# Patient Record
Sex: Male | Born: 1965
Health system: Southern US, Community
[De-identification: ages and names within clinical notes are randomized; demographics above are authoritative.]

## PROBLEM LIST (undated history)

## (undated) DIAGNOSIS — C801 Malignant (primary) neoplasm, unspecified: Secondary | ICD-10-CM

## (undated) DIAGNOSIS — Z8489 Family history of other specified conditions: Secondary | ICD-10-CM

## (undated) DIAGNOSIS — I4891 Unspecified atrial fibrillation: Secondary | ICD-10-CM

## (undated) DIAGNOSIS — I10 Essential (primary) hypertension: Secondary | ICD-10-CM

## (undated) DIAGNOSIS — E7800 Pure hypercholesterolemia, unspecified: Secondary | ICD-10-CM

## (undated) DIAGNOSIS — T7500XA Unspecified effects of lightning, initial encounter: Secondary | ICD-10-CM

## (undated) DIAGNOSIS — K219 Gastro-esophageal reflux disease without esophagitis: Secondary | ICD-10-CM

## (undated) HISTORY — PX: COLONOSCOPY: SHX174

## (undated) HISTORY — PX: TONSILLECTOMY: SUR1361

## (undated) HISTORY — PX: HERNIA REPAIR: SHX51

## (undated) HISTORY — PX: KNEE ARTHROSCOPY: SUR90

## (undated) HISTORY — PX: ESOPHAGOGASTRODUODENOSCOPY: SHX1529

---

## 1987-12-03 DIAGNOSIS — C801 Malignant (primary) neoplasm, unspecified: Secondary | ICD-10-CM

## 1987-12-03 HISTORY — DX: Malignant (primary) neoplasm, unspecified: C80.1

## 1989-01-25 DIAGNOSIS — T7500XA Unspecified effects of lightning, initial encounter: Secondary | ICD-10-CM

## 1989-01-25 HISTORY — DX: Unspecified effects of lightning, initial encounter: T75.00XA

## 1998-05-11 ENCOUNTER — Emergency Department (HOSPITAL_COMMUNITY): Admission: EM | Admit: 1998-05-11 | Discharge: 1998-05-11 | Payer: Self-pay | Admitting: Emergency Medicine

## 1998-05-11 ENCOUNTER — Encounter: Payer: Self-pay | Admitting: Emergency Medicine

## 1998-05-13 ENCOUNTER — Ambulatory Visit (HOSPITAL_COMMUNITY): Admission: RE | Admit: 1998-05-13 | Discharge: 1998-05-13 | Payer: Self-pay | Admitting: *Deleted

## 1998-05-14 ENCOUNTER — Ambulatory Visit (HOSPITAL_COMMUNITY): Admission: RE | Admit: 1998-05-14 | Discharge: 1998-05-14 | Payer: Self-pay

## 1998-06-09 ENCOUNTER — Encounter: Admission: RE | Admit: 1998-06-09 | Discharge: 1998-07-27 | Payer: Self-pay

## 1999-10-03 ENCOUNTER — Encounter: Admission: RE | Admit: 1999-10-03 | Discharge: 1999-11-01 | Payer: Self-pay | Admitting: Orthopedic Surgery

## 2000-12-31 ENCOUNTER — Encounter: Payer: Self-pay | Admitting: Emergency Medicine

## 2000-12-31 ENCOUNTER — Emergency Department (HOSPITAL_COMMUNITY): Admission: EM | Admit: 2000-12-31 | Discharge: 2001-01-01 | Payer: Self-pay | Admitting: Emergency Medicine

## 2001-05-09 ENCOUNTER — Emergency Department (HOSPITAL_COMMUNITY): Admission: EM | Admit: 2001-05-09 | Discharge: 2001-05-09 | Payer: Self-pay | Admitting: Emergency Medicine

## 2001-05-09 ENCOUNTER — Encounter: Payer: Self-pay | Admitting: Emergency Medicine

## 2002-02-15 ENCOUNTER — Emergency Department (HOSPITAL_COMMUNITY): Admission: EM | Admit: 2002-02-15 | Discharge: 2002-02-15 | Payer: Self-pay | Admitting: Emergency Medicine

## 2002-02-15 ENCOUNTER — Encounter: Payer: Self-pay | Admitting: Emergency Medicine

## 2002-02-17 ENCOUNTER — Encounter: Payer: Self-pay | Admitting: Specialist

## 2002-02-17 ENCOUNTER — Ambulatory Visit (HOSPITAL_COMMUNITY): Admission: RE | Admit: 2002-02-17 | Discharge: 2002-02-17 | Payer: Self-pay | Admitting: *Deleted

## 2002-02-18 ENCOUNTER — Encounter: Payer: Self-pay | Admitting: Specialist

## 2002-02-18 ENCOUNTER — Ambulatory Visit (HOSPITAL_COMMUNITY)
Admission: RE | Admit: 2002-02-18 | Discharge: 2002-02-18 | Payer: Self-pay | Admitting: Physical Medicine & Rehabilitation

## 2002-04-10 ENCOUNTER — Encounter: Admission: RE | Admit: 2002-04-10 | Discharge: 2002-07-09 | Payer: Self-pay | Admitting: Specialist

## 2002-11-17 ENCOUNTER — Emergency Department (HOSPITAL_COMMUNITY): Admission: EM | Admit: 2002-11-17 | Discharge: 2002-11-18 | Payer: Self-pay | Admitting: *Deleted

## 2002-11-25 ENCOUNTER — Emergency Department (HOSPITAL_COMMUNITY): Admission: EM | Admit: 2002-11-25 | Discharge: 2002-11-25 | Payer: Self-pay | Admitting: Emergency Medicine

## 2003-07-14 ENCOUNTER — Emergency Department (HOSPITAL_COMMUNITY): Admission: EM | Admit: 2003-07-14 | Discharge: 2003-07-14 | Payer: Self-pay | Admitting: Emergency Medicine

## 2003-12-09 ENCOUNTER — Ambulatory Visit (HOSPITAL_COMMUNITY): Admission: RE | Admit: 2003-12-09 | Discharge: 2003-12-09 | Payer: Self-pay | Admitting: Cardiology

## 2005-02-04 ENCOUNTER — Inpatient Hospital Stay (HOSPITAL_COMMUNITY): Admission: EM | Admit: 2005-02-04 | Discharge: 2005-02-07 | Payer: Self-pay | Admitting: Emergency Medicine

## 2005-02-06 ENCOUNTER — Encounter (INDEPENDENT_AMBULATORY_CARE_PROVIDER_SITE_OTHER): Payer: Self-pay | Admitting: Cardiology

## 2005-02-11 ENCOUNTER — Inpatient Hospital Stay (HOSPITAL_COMMUNITY): Admission: EM | Admit: 2005-02-11 | Discharge: 2005-02-12 | Payer: Self-pay | Admitting: Emergency Medicine

## 2010-07-27 ENCOUNTER — Emergency Department (HOSPITAL_COMMUNITY)
Admission: EM | Admit: 2010-07-27 | Discharge: 2010-07-27 | Disposition: A | Discharge status: Home / Self Care | Payer: Medicaid Other | Attending: Emergency Medicine | Admitting: Emergency Medicine

## 2010-07-27 DIAGNOSIS — Z79899 Other long term (current) drug therapy: Secondary | ICD-10-CM | POA: Insufficient documentation

## 2010-07-27 DIAGNOSIS — E039 Hypothyroidism, unspecified: Secondary | ICD-10-CM | POA: Insufficient documentation

## 2010-07-27 DIAGNOSIS — R079 Chest pain, unspecified: Secondary | ICD-10-CM | POA: Insufficient documentation

## 2010-07-27 LAB — POCT CARDIAC MARKERS
CKMB, poc: 3.4 ng/mL (ref 1.0–8.0)
Myoglobin, poc: 187 ng/mL (ref 12–200)
Troponin i, poc: <0.05 ng/mL (ref 0.00–0.09)

## 2010-08-19 NOTE — Discharge Summary (Signed)
NAME:  BONHAM, ZINGALE NO.:  [id number]   MEDICAL RECORD NO.:  [id number]          PATIENT TYPE:  INP   LOCATION:  3738                         FACILITY:  [facility]   PHYSICIAN:  Mohan N. Sharyn Lull, M.D. DATE OF BIRTH:  10-14-1965   DATE OF ADMISSION:  02/04/2005  DATE OF DISCHARGE:  02/07/2005                                 DISCHARGE SUMMARY   ADMITTING DIAGNOSES:  Chest pain, atrial fibrillation with rapid ventricular  response, hypertension, hypercholesterolemia.   DISCHARGE DIAGNOSES:  Status post paroxysmal atrial fibrillation with rapid  ventricular response converted to sinus rhythm, hypertension status post  atypical chest pain, negative Persantine Myoview, hypercholesterolemia,  questionable history of myocardial infarction in the past, gastroesophageal  reflux disease.   DISCHARGE MEDICATIONS:  1.  Toprol-XL 50 mg, one tablet daily.  2. Enteric-coated aspirin 81 mg, two      tablets daily.  3. Lipitor 20 mg, one tablet daily.  4. Protonix 40 mg,      one tablet daily.  5. Nitrostat 0.4 mg sublingual use as directed.   ACTIVITY:  As tolerated.   DIET:  Low salt, low cholesterol.   FOLLOWUP:  Follow up with me in one week.   CONDITION ON DISCHARGE:  Stable.   BRIEF HISTORY AND HOSPITAL COURSE:  Mr. Bloodworth is a 45 year old black  male with a past medical history significant for questionable MI.  He came  to the ER complaining of retrosternal chest pain, sharp in nature, lasting  for 1-2 minutes at 2 p.m. and at 7 p.m. radiating to the left arm.  No  history of cocaine abuse, no alcohol or smoking.  He also felt palpitations  associated with weakness, dizziness, and diaphoresis.  The patient was noted  to be in atrial fibrillation and rapid ventricular response and received  intravenous Cardizem with spontaneous conversion to sinus rhythm.  The  patient had stress test approximately three months ago which was normal.   PAST MEDICAL HISTORY:   Questionable history of MI in 1995, history of  hypercholesterolemia.   SOCIAL HISTORY:  No history of tobacco abuse.  He is divorced and has two  sons.  He works as an Personnel officer.   PAST SURGICAL HISTORY:  Tonsillectomy at the age of 1, umbilical hernia  repair in 1996, and left knee arthroscopic surgery in the past.   MEDICATIONS AT HOME:  Lipitor and Prilosec over-the-counter.   ALLERGIES:  No known drug allergies.   FAMILY HISTORY:  Mother died of lung cancer at the age of 18.  Father died  of renal failure at the age of 52.  He has three sisters; one died from car  accident.   PHYSICAL EXAMINATION:  General: Alert and oriented x3.  Vital signs: Blood  pressure 131/77, pulse 79.  HEENT: Conjunctiva was pink.  Neck: Supple, no  JVD, no bruits.  Lungs: Clear.  Cardiovascular: S1, S2 was normal.  Abdomen:  Soft, nontender.  Extremities: No cyanosis, clubbing, or edema.   LABORATORY DATA:  Chest x-ray: Cardiomegaly without evidence of acute  cardiopulmonary disease.  His stress Myoview showed no evidence of ischemia  with EF of 70%.  His cholesterol was 179, HDL 54, LDL elevated at 114.  TSH  was 0.01, free T4 was slightly elevated at 3.16.  His four set of cardiac  enzymes were normal.  Four sets of troponin-I's were normal.  Magnesium was  1.9.  Sodium 136, potassium 3.5, chloride 105, glucose 108, BUN 13,  creatinine 1.0.   BRIEF HOSPITAL COURSE:  The patient was admitted to the telemetry unit and  started on intravenous Cardizem which was switched to p.o. and heparin.  The  patient spontaneously converted to sinus rhythm after intravenous Cardizem  bolus and remained in sinus rhythm.  The patient's MI was ruled out by  serial enzymes and EKG.  The patient subsequently underwent stress Myoview  exercise for 10 minutes on Bruce Protocol without chest pain.  Peak heart  rate was 160.  Peak blood pressure was 203/72 with no acute ischemic  changes.  Myoview scan showed no  evidence of reversible ischemia with an EF  of 70%.  The patient then had one episode of chest pain yesterday which  responded to sublingual nitroglycerin.  The patient's EKG during chest pain  showed no acute ischemic changes.  The patient did not have any further  episodes of chest pain in the last 24 hours.  The patient will be discharged  home on the above medications and will be followed up in my office in one  week.           ______________________________  Eduardo Osier. Sharyn Lull, M.D.     [facility]  D:  02/07/2005  T:  02/07/2005  Job:  [id number]

## 2010-12-29 ENCOUNTER — Emergency Department (HOSPITAL_COMMUNITY): Payer: Medicaid Other

## 2010-12-29 ENCOUNTER — Emergency Department (HOSPITAL_COMMUNITY)
Admission: EM | Admit: 2010-12-29 | Discharge: 2010-12-29 | Disposition: A | Discharge status: Home / Self Care | Payer: Medicaid Other | Attending: Emergency Medicine | Admitting: Emergency Medicine

## 2010-12-29 DIAGNOSIS — E039 Hypothyroidism, unspecified: Secondary | ICD-10-CM | POA: Insufficient documentation

## 2010-12-29 DIAGNOSIS — R079 Chest pain, unspecified: Secondary | ICD-10-CM | POA: Insufficient documentation

## 2010-12-29 DIAGNOSIS — M25519 Pain in unspecified shoulder: Secondary | ICD-10-CM | POA: Insufficient documentation

## 2010-12-29 DIAGNOSIS — W208XXA Other cause of strike by thrown, projected or falling object, initial encounter: Secondary | ICD-10-CM | POA: Insufficient documentation

## 2011-06-17 ENCOUNTER — Emergency Department (HOSPITAL_COMMUNITY)
Admission: EM | Admit: 2011-06-17 | Discharge: 2011-06-17 | Disposition: A | Discharge status: Home Health | Payer: Medicaid Other | Attending: Emergency Medicine | Admitting: Emergency Medicine

## 2011-06-17 ENCOUNTER — Encounter (HOSPITAL_COMMUNITY): Payer: Self-pay

## 2011-06-17 DIAGNOSIS — R040 Epistaxis: Secondary | ICD-10-CM | POA: Insufficient documentation

## 2011-06-17 DIAGNOSIS — I4891 Unspecified atrial fibrillation: Secondary | ICD-10-CM | POA: Insufficient documentation

## 2011-06-17 DIAGNOSIS — E7800 Pure hypercholesterolemia, unspecified: Secondary | ICD-10-CM | POA: Insufficient documentation

## 2011-06-17 DIAGNOSIS — R51 Headache: Secondary | ICD-10-CM | POA: Insufficient documentation

## 2011-06-17 DIAGNOSIS — Z79899 Other long term (current) drug therapy: Secondary | ICD-10-CM | POA: Insufficient documentation

## 2011-06-17 DIAGNOSIS — R748 Abnormal levels of other serum enzymes: Secondary | ICD-10-CM | POA: Insufficient documentation

## 2011-06-17 HISTORY — DX: Unspecified atrial fibrillation: I48.91

## 2011-06-17 HISTORY — DX: Pure hypercholesterolemia, unspecified: E78.00

## 2011-06-17 LAB — CBC
HCT: 49.6 % (ref 39.0–52.0)
Hemoglobin: 17.3 g/dL — ABNORMAL HIGH (ref 13.0–17.0)
MCH: 30.3 pg (ref 26.0–34.0)
MCHC: 34.9 g/dL (ref 30.0–36.0)
Platelets: 162 10*3/uL (ref 150–400)
RBC: 5.71 MIL/uL (ref 4.22–5.81)
RDW: 12.8 % (ref 11.5–15.5)
WBC: 4.2 10*3/uL (ref 4.0–10.5)

## 2011-06-17 LAB — HEPATIC FUNCTION PANEL
ALT: 41 U/L (ref 0–53)
AST: 21 U/L (ref 0–37)
Albumin: 3.8 g/dL (ref 3.5–5.2)
Alkaline Phosphatase: 93 U/L (ref 39–117)
Bilirubin, Direct: 0.1 mg/dL (ref 0.0–0.3)
Indirect Bilirubin: 0.4 mg/dL (ref 0.3–0.9)
Total Bilirubin: 0.5 mg/dL (ref 0.3–1.2)
Total Protein: 6.9 g/dL (ref 6.0–8.3)

## 2011-06-17 LAB — DIFFERENTIAL
Basophils Absolute: 0 10*3/uL (ref 0.0–0.1)
Basophils Relative: 1 % (ref 0–1)
Lymphs Abs: 1.5 10*3/uL (ref 0.7–4.0)
Monocytes Relative: 6 % (ref 3–12)
Neutro Abs: 2.3 10*3/uL (ref 1.7–7.7)
Neutrophils Relative %: 56 % (ref 43–77)

## 2011-06-17 LAB — CK: Total CK: 451 U/L — ABNORMAL HIGH (ref 7–232)

## 2011-06-17 MED ORDER — OXYMETAZOLINE HCL 0.05 % NA SOLN
1.0000 | Freq: Once | NASAL | Status: AC
  Administered 2011-06-17: 1 via NASAL
  Filled 2011-06-17: qty 15

## 2011-06-17 NOTE — ED Provider Notes (Signed)
Medical screening examination/treatment/procedure(s) were performed by non-physician practitioner and as supervising physician I was immediately available for consultation/collaboration.  Cheri Guppy, MD 06/17/11 [phone]

## 2011-06-17 NOTE — ED Notes (Signed)
Patient is resting comfortably. 

## 2011-06-17 NOTE — Discharge Instructions (Signed)

## 2011-06-17 NOTE — ED Provider Notes (Signed)
History     CSN: [id number]  Arrival date & time 06/17/11  4098   First MD Initiated Contact with Patient 06/17/11 1007      Chief Complaint  Patient presents with  . Epistaxis  . Headache    (Consider location/radiation/quality/duration/timing/severity/associated sxs/prior treatment) HPI Comments: Patient here with two week history of nosebleeds - states that this started after he was placed on lipitor, he reports taking the lipitor in the mornings and about 1 hour after taking he has a nose bleed to the left nare, denies any trauma to the nose, picking or recent cold.  States has a history of afib which has been controlled on cardizem and he is not currently on anticoagulant therapy - states no history of high blood pressure but he has noticed that when this happens he also gets a headache and his blood pressure is high.  Denies current symptoms but states that when he was on the way here he developed a nose bleed which spontaneously resolved.  Patient is a 46 y.o. male presenting with nosebleeds and headaches. The history is provided by the patient. No language interpreter was used.  Epistaxis  This is a new problem. The current episode started more than 1 week ago. The problem occurs every several days. The problem has not changed since onset.The problem is associated with an unknown factor. The bleeding has been from the left nare. He has tried applying pressure for the symptoms. The treatment provided moderate relief. His past medical history does not include bleeding disorder, colds, sinus problems, allergies, nose-picking or frequent nosebleeds.  Headache  Pertinent negatives include no shortness of breath.    Past Medical History  Diagnosis Date  . Atrial fibrillation   . High cholesterol     History reviewed. No pertinent past surgical history.  History reviewed. No pertinent family history.  History  Substance Use Topics  . Smoking status: Never Smoker   . Smokeless  tobacco: Not on file  . Alcohol Use: No      Review of Systems  HENT: Positive for nosebleeds. Negative for hearing loss, ear pain, congestion, rhinorrhea, sneezing, trouble swallowing, neck pain, postnasal drip and sinus pressure.   Respiratory: Negative for shortness of breath.   Cardiovascular: Negative for chest pain.  Gastrointestinal: Negative for abdominal pain.  Neurological: Positive for headaches.  All other systems reviewed and are negative.    Allergies  Metoprolol tartrate  Home Medications   Current Outpatient Rx  Name Route Sig Dispense Refill  . DILTIAZEM HCL ER COATED BEADS 300 MG PO CP24 Oral Take 300 mg by mouth daily.    Marland Kitchen ESOMEPRAZOLE MAGNESIUM 40 MG PO CPDR Oral Take 40 mg by mouth daily before breakfast.    . OMEGA-3 FATTY ACIDS 1000 MG PO CAPS Oral Take 1 g by mouth 3 (three) times daily.      BP 180/94  Pulse 72  Temp(Src) 98.2 F (36.8 C) (Oral)  Resp 18  SpO2 98%  Physical Exam  Nursing note and vitals reviewed. Constitutional: He is oriented to person, place, and time. He appears well-developed and well-nourished. No distress.  HENT:  Head: Normocephalic and atraumatic.  Right Ear: External ear normal.  Left Ear: External ear normal.  Nose: Nose normal.  Mouth/Throat: Oropharynx is clear and moist. No oropharyngeal exudate.       No anterior or septal hematoma noted.  Eyes: Conjunctivae are normal. Pupils are equal, round, and reactive to light. No scleral icterus.  Neck: Normal  range of motion. Neck supple.  Cardiovascular: Normal rate, regular rhythm and normal heart sounds.  Exam reveals no gallop and no friction rub.   No murmur heard. Pulmonary/Chest: Effort normal and breath sounds normal. No respiratory distress. He exhibits no tenderness.  Abdominal: Soft. Bowel sounds are normal. He exhibits no distension and no mass.  Musculoskeletal: Normal range of motion. He exhibits no edema and no tenderness.  Lymphadenopathy:    He has  no cervical adenopathy.  Neurological: He is alert and oriented to person, place, and time. No cranial nerve deficit.  Skin: Skin is warm and dry. No rash noted. No erythema. No pallor.  Psychiatric: He has a normal mood and affect. His behavior is normal. Judgment and thought content normal.    ED Course  Procedures (including critical care time)  Labs Reviewed  CBC - Abnormal; Notable for the following:    Hemoglobin 17.3 (*)    All other components within normal limits  CK - Abnormal; Notable for the following:    Total CK 451 (*)    All other components within normal limits  DIFFERENTIAL  HEPATIC FUNCTION PANEL   No results found.   1. Epistaxis   2. Elevated CK       MDM  Patient without bleeding here - counts are normal so I feel that we can discharge him home - as he believes this to be related to the lipitor, we will have him hold this until he can follow up with his PCP.        Izola Price [place], [place] 06/17/11 1353

## 2011-06-17 NOTE — ED Notes (Signed)
Placed on lipitor 2 weeks ago and since then has had nose bleeds and coughing up blood, complains of dizzy spells and headache on and off since starting medicine also, hx of afib, denie use of anticoagulants

## 2011-06-17 NOTE — ED Notes (Signed)
Pt resting quietly. Denies any pain. No further nasal bleeding noted

## 2012-09-11 ENCOUNTER — Encounter (HOSPITAL_COMMUNITY): Payer: Self-pay | Admitting: *Deleted

## 2012-09-11 ENCOUNTER — Emergency Department (HOSPITAL_COMMUNITY)
Admission: EM | Admit: 2012-09-11 | Discharge: 2012-09-11 | Disposition: A | Discharge status: Home / Self Care | Payer: Self-pay | Attending: Emergency Medicine | Admitting: Emergency Medicine

## 2012-09-11 ENCOUNTER — Emergency Department (HOSPITAL_COMMUNITY): Payer: Self-pay

## 2012-09-11 DIAGNOSIS — I4891 Unspecified atrial fibrillation: Secondary | ICD-10-CM | POA: Insufficient documentation

## 2012-09-11 DIAGNOSIS — M25512 Pain in left shoulder: Secondary | ICD-10-CM

## 2012-09-11 DIAGNOSIS — E7800 Pure hypercholesterolemia, unspecified: Secondary | ICD-10-CM | POA: Insufficient documentation

## 2012-09-11 DIAGNOSIS — M25519 Pain in unspecified shoulder: Secondary | ICD-10-CM | POA: Insufficient documentation

## 2012-09-11 DIAGNOSIS — Z79899 Other long term (current) drug therapy: Secondary | ICD-10-CM | POA: Insufficient documentation

## 2012-09-11 MED ORDER — TRAMADOL HCL 50 MG PO TABS: 50.0000 mg | ORAL_TABLET | Freq: Four times a day (QID) | ORAL | Status: DC | PRN

## 2012-09-11 MED ORDER — MELOXICAM 15 MG PO TABS: 15.0000 mg | ORAL_TABLET | Freq: Every day | ORAL | Status: DC

## 2012-09-11 MED ORDER — NAPROXEN 250 MG PO TABS
500.0000 mg | ORAL_TABLET | Freq: Once | ORAL | Status: AC
  Administered 2012-09-11: 500 mg via ORAL
  Filled 2012-09-11: qty 1

## 2012-09-11 NOTE — ED Notes (Signed)
PT states 3 weeks ago was fishing and left shoulder started hurting then it stopped.  Then Saturday it started hurting again and then got bad to the point he could not lift it.  No chest pain or sob

## 2012-09-11 NOTE — ED Notes (Signed)
Arm sling applied to  Left arm. Instructed patient on how to remove and place on. Good radial pulse after placement. No further questions .

## 2012-09-11 NOTE — ED Notes (Signed)
C/o left shoulder pain onset 3 weeks ago. States pain started after he was fishing, resolved and then Sat he went fishing and sat night pain started again, pain radiates to his elbow. Positive left radial pulse.

## 2012-09-11 NOTE — ED Provider Notes (Signed)
History     CSN: [id number]  Arrival date & time 09/11/12  9629   First MD Initiated Contact with Patient 09/11/12 1007      Chief Complaint  Patient presents with  . Shoulder Pain    (Consider location/radiation/quality/duration/timing/severity/associated sxs/prior treatment) HPI David Gross is a 47 y.o. male who presents to ED with complaint left shoulder pain. States first had pain about a month ago while fishing. State was hurting for 1 week but then improved. Pt reports pain began again 3 days ago. States pain with movement of the shoulder joint. Unable to lift arm up. Did not take any medications for this at home. No known injury. Has not had it looked at prior to coming in. No numbness or weakness in hand. No neck pain. No shortness of breath or chest pain.    Past Medical History  Diagnosis Date  . Atrial fibrillation   . High cholesterol     History reviewed. No pertinent past surgical history.  No family history on file.  History  Substance Use Topics  . Smoking status: Never Smoker   . Smokeless tobacco: Not on file  . Alcohol Use: No      Review of Systems  Constitutional: Negative for fever and chills.  HENT: Negative for neck pain and neck stiffness.   Respiratory: Negative.   Cardiovascular: Negative.   Musculoskeletal: Positive for arthralgias.  Neurological: Negative for weakness and numbness.    Allergies  Metoprolol tartrate  Home Medications   Current Outpatient Rx  Name  Route  Sig  Dispense  Refill  . diltiazem (CARDIZEM CD) 300 MG 24 hr capsule   Oral   Take 300 mg by mouth daily.         . fish oil-omega-3 fatty acids 1000 MG capsule   Oral   Take 1 g by mouth 3 (three) times daily.           BP 147/88  Pulse 65  Temp(Src) 98.2 F (36.8 C) (Oral)  Resp 18  SpO2 95%  Physical Exam  Nursing note and vitals reviewed. Constitutional: He appears well-developed and well-nourished. No distress.  HENT:  Head:  Normocephalic.  Eyes: Conjunctivae are normal.  Neck: Normal range of motion. Neck supple.  Cardiovascular: Normal rate, regular rhythm and normal heart sounds.   Pulmonary/Chest: Effort normal and breath sounds normal. No respiratory distress. He has no wheezes. He has no rales.  Musculoskeletal:  Normal appearing left shoulder. Tender over trapezius muscle. Tender over anterior and posterior left shoulder joint. Pain with any shoulder ROM. Positive straight arm drop test. 5/5 and equal bicep and tricep strength against resistance. No arm or shoulder joint swelling. No erythema.     Neurological: He is alert.  Skin: Skin is warm and dry.    ED Course  Procedures (including critical care time)  Labs Reviewed - No data to display Dg Shoulder Left  09/11/2012   *RADIOLOGY REPORT*  Clinical Data: Left shoulder pain.  No known injury.  LEFT SHOULDER - 2+ VIEW  Comparison: Plain films 12/29/2010.  Findings: The humerus is located and the acromioclavicular joint is intact.  There is no fracture.  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  Imaged left lung and ribs appear normal.  IMPRESSION: No acute finding.  No change in mild acromioclavicular degenerative disease.   Original Report Authenticated By: Holley Dexter, M.D.    Date: 09/11/2012  Rate: 73  Rhythm: normal sinus rhythm  QRS Axis: normal  Intervals: normal  ST/T Wave abnormalities: normal  Conduction Disutrbances: none  Narrative Interpretation:   Old EKG Reviewed: No significant changes noted      1. Shoulder pain, left       MDM  PT with left shoulder pain. No known injuries. Does not appear to be infected. No obvious swelling or deformity. Mild degenerative disease on x-ray. limited rom of the joint due to pain. Positive drop test. Suspect possible rotator cuff injury. Given sling. Will start on mobic and ultram. Follow up with orthopedics.   Filed Vitals:   09/11/12 0923  BP: 147/88  Pulse:  65  Temp: 98.2 F (36.8 C)  TempSrc: Oral  Resp: 18  SpO2: 95%           Chukwuma Straus A Jhana Giarratano, PA-C 09/11/12 1058

## 2012-09-12 NOTE — ED Provider Notes (Signed)
Medical screening examination/treatment/procedure(s) were performed by non-physician practitioner and as supervising physician I was immediately available for consultation/collaboration.   Hendel Gatliff J. Susette Seminara, MD 09/12/12 0708 

## 2014-01-14 ENCOUNTER — Encounter: Payer: Self-pay | Admitting: *Deleted

## 2014-01-22 ENCOUNTER — Encounter (HOSPITAL_COMMUNITY): Payer: Self-pay | Admitting: Emergency Medicine

## 2014-01-22 ENCOUNTER — Emergency Department (HOSPITAL_COMMUNITY)
Admission: EM | Admit: 2014-01-22 | Discharge: 2014-01-22 | Disposition: A | Discharge status: Home / Self Care | Payer: No Typology Code available for payment source | Attending: Emergency Medicine | Admitting: Emergency Medicine

## 2014-01-22 ENCOUNTER — Emergency Department (HOSPITAL_COMMUNITY): Payer: No Typology Code available for payment source

## 2014-01-22 DIAGNOSIS — S4991XA Unspecified injury of right shoulder and upper arm, initial encounter: Secondary | ICD-10-CM | POA: Diagnosis not present

## 2014-01-22 DIAGNOSIS — Y92410 Unspecified street and highway as the place of occurrence of the external cause: Secondary | ICD-10-CM | POA: Diagnosis not present

## 2014-01-22 DIAGNOSIS — S29091A Other injury of muscle and tendon of front wall of thorax, initial encounter: Secondary | ICD-10-CM | POA: Diagnosis not present

## 2014-01-22 DIAGNOSIS — I4891 Unspecified atrial fibrillation: Secondary | ICD-10-CM | POA: Insufficient documentation

## 2014-01-22 DIAGNOSIS — Z8639 Personal history of other endocrine, nutritional and metabolic disease: Secondary | ICD-10-CM | POA: Insufficient documentation

## 2014-01-22 DIAGNOSIS — Z859 Personal history of malignant neoplasm, unspecified: Secondary | ICD-10-CM | POA: Insufficient documentation

## 2014-01-22 DIAGNOSIS — Z79899 Other long term (current) drug therapy: Secondary | ICD-10-CM | POA: Diagnosis not present

## 2014-01-22 DIAGNOSIS — S0990XA Unspecified injury of head, initial encounter: Secondary | ICD-10-CM | POA: Diagnosis not present

## 2014-01-22 DIAGNOSIS — Y9389 Activity, other specified: Secondary | ICD-10-CM | POA: Diagnosis not present

## 2014-01-22 HISTORY — DX: Unspecified effects of lightning, initial encounter: T75.00XA

## 2014-01-22 HISTORY — DX: Malignant (primary) neoplasm, unspecified: C80.1

## 2014-01-22 MED ORDER — OXYCODONE-ACETAMINOPHEN 5-325 MG PO TABS
2.0000 | ORAL_TABLET | Freq: Once | ORAL | Status: AC
  Administered 2014-01-22: 2 via ORAL
  Filled 2014-01-22: qty 2

## 2014-01-22 MED ORDER — OXYCODONE-ACETAMINOPHEN 5-325 MG PO TABS: 1.0000 | ORAL_TABLET | ORAL | Status: DC | PRN
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
## 2014-01-29 ENCOUNTER — Emergency Department (HOSPITAL_BASED_OUTPATIENT_CLINIC_OR_DEPARTMENT_OTHER): Payer: No Typology Code available for payment source

## 2014-01-29 ENCOUNTER — Emergency Department (HOSPITAL_BASED_OUTPATIENT_CLINIC_OR_DEPARTMENT_OTHER)
Admission: EM | Admit: 2014-01-29 | Discharge: 2014-01-29 | Disposition: A | Discharge status: Home / Self Care | Payer: No Typology Code available for payment source | Attending: Emergency Medicine | Admitting: Emergency Medicine

## 2014-01-29 ENCOUNTER — Encounter (HOSPITAL_BASED_OUTPATIENT_CLINIC_OR_DEPARTMENT_OTHER): Payer: Self-pay | Admitting: Emergency Medicine

## 2014-01-29 DIAGNOSIS — S0990XA Unspecified injury of head, initial encounter: Secondary | ICD-10-CM | POA: Insufficient documentation

## 2014-01-29 DIAGNOSIS — Y9389 Activity, other specified: Secondary | ICD-10-CM | POA: Insufficient documentation

## 2014-01-29 DIAGNOSIS — S161XXA Strain of muscle, fascia and tendon at neck level, initial encounter: Secondary | ICD-10-CM | POA: Insufficient documentation

## 2014-01-29 DIAGNOSIS — Y92410 Unspecified street and highway as the place of occurrence of the external cause: Secondary | ICD-10-CM | POA: Insufficient documentation

## 2014-01-29 DIAGNOSIS — Z859 Personal history of malignant neoplasm, unspecified: Secondary | ICD-10-CM | POA: Diagnosis not present

## 2014-01-29 DIAGNOSIS — I4891 Unspecified atrial fibrillation: Secondary | ICD-10-CM | POA: Diagnosis not present

## 2014-01-29 DIAGNOSIS — Z8639 Personal history of other endocrine, nutritional and metabolic disease: Secondary | ICD-10-CM | POA: Insufficient documentation

## 2014-01-29 DIAGNOSIS — Z79899 Other long term (current) drug therapy: Secondary | ICD-10-CM | POA: Diagnosis not present
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
## 2014-01-29 NOTE — ED Notes (Signed)
MD at bedside. 
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
## 2014-04-16 ENCOUNTER — Encounter (HOSPITAL_BASED_OUTPATIENT_CLINIC_OR_DEPARTMENT_OTHER): Payer: Self-pay | Admitting: *Deleted

## 2014-04-16 ENCOUNTER — Emergency Department (HOSPITAL_BASED_OUTPATIENT_CLINIC_OR_DEPARTMENT_OTHER): Payer: 59

## 2014-04-16 ENCOUNTER — Emergency Department (HOSPITAL_BASED_OUTPATIENT_CLINIC_OR_DEPARTMENT_OTHER)
Admission: EM | Admit: 2014-04-16 | Discharge: 2014-04-17 | Disposition: A | Discharge status: Home / Self Care | Payer: 59 | Attending: Emergency Medicine | Admitting: Emergency Medicine

## 2014-04-16 DIAGNOSIS — Y92410 Unspecified street and highway as the place of occurrence of the external cause: Secondary | ICD-10-CM | POA: Diagnosis not present

## 2014-04-16 DIAGNOSIS — Y998 Other external cause status: Secondary | ICD-10-CM | POA: Diagnosis not present

## 2014-04-16 DIAGNOSIS — S199XXA Unspecified injury of neck, initial encounter: Secondary | ICD-10-CM | POA: Insufficient documentation

## 2014-04-16 DIAGNOSIS — Z8589 Personal history of malignant neoplasm of other organs and systems: Secondary | ICD-10-CM | POA: Insufficient documentation

## 2014-04-16 DIAGNOSIS — Z79899 Other long term (current) drug therapy: Secondary | ICD-10-CM | POA: Diagnosis not present

## 2014-04-16 DIAGNOSIS — S0990XA Unspecified injury of head, initial encounter: Secondary | ICD-10-CM | POA: Insufficient documentation

## 2014-04-16 DIAGNOSIS — Z8639 Personal history of other endocrine, nutritional and metabolic disease: Secondary | ICD-10-CM | POA: Diagnosis not present

## 2014-04-16 DIAGNOSIS — I4891 Unspecified atrial fibrillation: Secondary | ICD-10-CM | POA: Insufficient documentation

## 2014-04-16 DIAGNOSIS — S3992XA Unspecified injury of lower back, initial encounter: Secondary | ICD-10-CM | POA: Insufficient documentation

## 2014-04-16 DIAGNOSIS — Y9389 Activity, other specified: Secondary | ICD-10-CM | POA: Diagnosis not present

## 2014-04-16 DIAGNOSIS — M542 Cervicalgia: Secondary | ICD-10-CM

## 2014-04-16 DIAGNOSIS — R202 Paresthesia of skin: Secondary | ICD-10-CM | POA: Insufficient documentation

## 2014-04-16 MED ORDER — IBUPROFEN 800 MG PO TABS
800.0000 mg | ORAL_TABLET | Freq: Once | ORAL | Status: AC
  Administered 2014-04-16: 800 mg via ORAL
  Filled 2014-04-16: qty 1

## 2014-04-16 MED ORDER — OXYCODONE-ACETAMINOPHEN 5-325 MG PO TABS: 1.0000 | ORAL_TABLET | ORAL | Status: DC | PRN

## 2014-04-16 MED ORDER — OXYCODONE-ACETAMINOPHEN 5-325 MG PO TABS
1.0000 | ORAL_TABLET | Freq: Once | ORAL | Status: AC
  Administered 2014-04-16: 1 via ORAL
  Filled 2014-04-16: qty 1
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
## 2014-04-20 ENCOUNTER — Encounter (HOSPITAL_BASED_OUTPATIENT_CLINIC_OR_DEPARTMENT_OTHER): Payer: Self-pay | Admitting: *Deleted

## 2014-04-20 ENCOUNTER — Emergency Department (HOSPITAL_BASED_OUTPATIENT_CLINIC_OR_DEPARTMENT_OTHER)
Admission: EM | Admit: 2014-04-20 | Discharge: 2014-04-20 | Disposition: A | Discharge status: Home / Self Care | Payer: 59 | Attending: Emergency Medicine | Admitting: Emergency Medicine

## 2014-04-20 DIAGNOSIS — R319 Hematuria, unspecified: Secondary | ICD-10-CM | POA: Diagnosis not present

## 2014-04-20 DIAGNOSIS — R202 Paresthesia of skin: Secondary | ICD-10-CM | POA: Diagnosis not present

## 2014-04-20 DIAGNOSIS — Y9389 Activity, other specified: Secondary | ICD-10-CM | POA: Diagnosis not present

## 2014-04-20 DIAGNOSIS — Z8679 Personal history of other diseases of the circulatory system: Secondary | ICD-10-CM | POA: Insufficient documentation

## 2014-04-20 DIAGNOSIS — Z859 Personal history of malignant neoplasm, unspecified: Secondary | ICD-10-CM | POA: Insufficient documentation

## 2014-04-20 DIAGNOSIS — Z8639 Personal history of other endocrine, nutritional and metabolic disease: Secondary | ICD-10-CM | POA: Diagnosis not present

## 2014-04-20 DIAGNOSIS — Y92410 Unspecified street and highway as the place of occurrence of the external cause: Secondary | ICD-10-CM | POA: Diagnosis not present

## 2014-04-20 DIAGNOSIS — Y998 Other external cause status: Secondary | ICD-10-CM | POA: Insufficient documentation

## 2014-04-20 DIAGNOSIS — S199XXA Unspecified injury of neck, initial encounter: Secondary | ICD-10-CM | POA: Diagnosis not present

## 2014-04-20 DIAGNOSIS — Z79899 Other long term (current) drug therapy: Secondary | ICD-10-CM | POA: Insufficient documentation

## 2014-04-20 DIAGNOSIS — S3992XA Unspecified injury of lower back, initial encounter: Secondary | ICD-10-CM | POA: Diagnosis not present

## 2014-04-20 LAB — URINALYSIS, ROUTINE W REFLEX MICROSCOPIC
BILIRUBIN URINE: NEGATIVE
Glucose, UA: NEGATIVE mg/dL
Ketones, ur: NEGATIVE mg/dL
Leukocytes, UA: NEGATIVE
Nitrite: NEGATIVE
PH: 5.5 (ref 5.0–8.0)
Protein, ur: NEGATIVE mg/dL
Specific Gravity, Urine: 1.027 (ref 1.005–1.030)
UROBILINOGEN UA: 0.2 mg/dL (ref 0.0–1.0)

## 2014-04-20 LAB — URINE MICROSCOPIC-ADD ON

## 2014-04-20 LAB — BASIC METABOLIC PANEL
ANION GAP: 6 (ref 5–15)
BUN: 11 mg/dL (ref 6–23)
CO2: 27 mmol/L (ref 19–32)
Calcium: 9.1 mg/dL (ref 8.4–10.5)
Chloride: 108 mEq/L (ref 96–112)
Creatinine, Ser: 1.08 mg/dL (ref 0.50–1.35)
GFR calc Af Amer: >90 mL/min (ref >90)
GFR calc non Af Amer: 78 mL/min — ABNORMAL LOW (ref >90)
GLUCOSE: 105 mg/dL — AB (ref 70–99)
Potassium: 3.9 mmol/L (ref 3.5–5.1)
Sodium: 141 mmol/L (ref 135–145)

## 2014-04-20 LAB — CBC WITH DIFFERENTIAL/PLATELET
BASOS ABS: 0 10*3/uL (ref 0.0–0.1)
BASOS PCT: 1 % (ref 0–1)
Eosinophils Absolute: 0 10*3/uL (ref 0.0–0.7)
Eosinophils Relative: 1 % (ref 0–5)
HEMATOCRIT: 51.8 % (ref 39.0–52.0)
HEMOGLOBIN: 18 g/dL — AB (ref 13.0–17.0)
Lymphocytes Relative: 42 % (ref 12–46)
Lymphs Abs: 1.8 10*3/uL (ref 0.7–4.0)
MCH: 30 pg (ref 26.0–34.0)
MCHC: 34.7 g/dL (ref 30.0–36.0)
MCV: 86.3 fL (ref 78.0–100.0)
MONO ABS: 0.3 10*3/uL (ref 0.1–1.0)
Monocytes Relative: 8 % (ref 3–12)
Neutro Abs: 2 10*3/uL (ref 1.7–7.7)
Neutrophils Relative %: 48 % (ref 43–77)
Platelets: 176 10*3/uL (ref 150–400)
RBC: 6 MIL/uL — ABNORMAL HIGH (ref 4.22–5.81)
RDW: 13 % (ref 11.5–15.5)
WBC: 4.1 10*3/uL (ref 4.0–10.5)

## 2014-04-20 MED ORDER — DILTIAZEM HCL ER COATED BEADS 300 MG PO CP24: 300.0000 mg | ORAL_CAPSULE | Freq: Every day | ORAL | Status: AC
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
## 2014-04-20 NOTE — Discharge Instructions (Signed)
Paresthesia Paresthesia is an abnormal burning or prickling sensation. This sensation is generally felt in the hands, arms, legs, or feet. However, it may occur in any part of the body. It is usually not painful. The feeling may be described as:  Tingling or numbness.  "Pins and needles."  Skin crawling.  Buzzing.  Limbs "falling asleep."  Itching. Most people experience temporary (transient) paresthesia at some time in their lives. CAUSES  Paresthesia may occur when you breathe too quickly (hyperventilation). It can also occur without any apparent cause. Commonly, paresthesia occurs when pressure is placed on a nerve. The feeling quickly goes away once the pressure is removed. For some people, however, paresthesia is a long-lasting (chronic) condition caused by an underlying disorder. The underlying disorder may be:  A traumatic, direct injury to nerves. Examples include a:  Broken (fractured) neck.  Fractured skull.  A disorder affecting the brain and spinal cord (central nervous system). Examples include:  Transverse myelitis.  Encephalitis.  Transient ischemic attack.  Multiple sclerosis.  Stroke.  Tumor or blood vessel problems, such as an arteriovenous malformation pressing against the brain or spinal cord.  A condition that damages the peripheral nerves (peripheral neuropathy). Peripheral nerves are not part of the brain and spinal cord. These conditions include:  Diabetes.  Peripheral vascular disease.  Nerve entrapment syndromes, such as carpal tunnel syndrome.  Shingles.  Hypothyroidism.  Vitamin B12 deficiencies.  Alcoholism.  Heavy metal poisoning (lead, arsenic).  Rheumatoid arthritis.  Systemic lupus erythematosus. DIAGNOSIS  Your caregiver will attempt to find the underlying cause of your paresthesia. Your caregiver may:  Take your medical history.  Perform a physical exam.  Order various lab tests.  Order imaging tests. TREATMENT    Treatment for paresthesia depends on the underlying cause. HOME CARE INSTRUCTIONS  Avoid drinking alcohol.  You may consider massage or acupuncture to help relieve your symptoms.  Keep all follow-up appointments as directed by your caregiver. SEEK IMMEDIATE MEDICAL CARE IF:   You feel weak.  You have trouble walking or moving.  You have problems with speech or vision.  You feel confused.  You cannot control your bladder or bowel movements.  You feel numbness after an injury.  You faint.  Your burning or prickling feeling gets worse when walking.  You have pain, cramps, or dizziness.  You develop a rash. MAKE SURE YOU:  Understand these instructions.  Will watch your condition.  Will get help right away if you are not doing well or get worse. Document Released: 03/10/2002 Document Revised: 06/12/2011 Document Reviewed: 12/09/2010 [facility] Patient Information 2015 [place], [place]. This information is not intended to replace advice given to you by your health care provider. Make sure you disc  Hematuria Hematuria is blood in your urine. It can be caused by a bladder infection, kidney infection, prostate infection, kidney stone, or cancer of your urinary tract. Infections can usually be treated with medicine, and a kidney stone usually will pass through your urine. If neither of these is the cause of your hematuria, further workup to find out the reason may be needed. It is very important that you tell your health care provider about any blood you see in your urine, even if the blood stops without treatment or happens without causing pain. Blood in your urine that happens and then stops and then happens again can be a symptom of a very serious condition. Also, pain is not a symptom in the initial stages of many urinary cancers. HOME CARE  INSTRUCTIONS   Drink lots of fluid, 3-4 quarts a day. If you have been diagnosed with an infection, cranberry juice is especially  recommended, in addition to large amounts of water.  Avoid caffeine, tea, and carbonated beverages because they tend to irritate the bladder.  Avoid alcohol because it may irritate the prostate.  Take all medicines as directed by your health care provider.  If you were prescribed an antibiotic medicine, finish it all even if you start to feel better.  If you have been diagnosed with a kidney stone, follow your health care provider's instructions regarding straining your urine to catch the stone.  Empty your bladder often. Avoid holding urine for long periods of time.  After a bowel movement, women should cleanse front to back. Use each tissue only once.  Empty your bladder before and after sexual intercourse if you are a male. SEEK MEDICAL CARE IF:  You develop back pain.  You have a fever.  You have a feeling of sickness in your stomach (nausea) or vomiting.  Your symptoms are not better in 3 days. Return sooner if you are getting worse. SEEK IMMEDIATE MEDICAL CARE IF:   You develop severe vomiting and are unable to keep the medicine down.  You develop severe back or abdominal pain despite taking your medicines.  You begin passing a large amount of blood or clots in your urine.  You feel extremely weak or faint, or you pass out. MAKE SURE YOU:   Understand these instructions.  Will watch your condition.  Will get help right away if you are not doing well or get worse. Document Released: 03/20/2005 Document Revised: 08/04/2013 Document Reviewed: 11/18/2012 [facility] Patient Information 2015 [place], [place]. This information is not intended to replace advice given to you by your health care provider. Make sure you discuss any questions you have with your health care provider. uss any questions you have with your health care provider.
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
## 2014-04-21 ENCOUNTER — Other Ambulatory Visit: Payer: Self-pay | Admitting: Family Medicine

## 2014-04-21 DIAGNOSIS — R310 Gross hematuria: Secondary | ICD-10-CM

## 2014-04-21 DIAGNOSIS — M542 Cervicalgia: Secondary | ICD-10-CM

## 2014-04-22 ENCOUNTER — Ambulatory Visit
Admission: RE | Admit: 2014-04-22 | Discharge: 2014-04-22 | Disposition: A | Discharge status: Home / Self Care | Payer: 59 | Source: Ambulatory Visit | Attending: Family Medicine | Admitting: Family Medicine

## 2014-04-22 DIAGNOSIS — M542 Cervicalgia: Secondary | ICD-10-CM

## 2014-04-22 DIAGNOSIS — R310 Gross hematuria: Secondary | ICD-10-CM

## 2014-04-23 ENCOUNTER — Ambulatory Visit
Admission: RE | Admit: 2014-04-23 | Discharge: 2014-04-23 | Disposition: A | Discharge status: Home / Self Care | Payer: 59 | Source: Ambulatory Visit | Attending: Family Medicine | Admitting: Family Medicine

## 2014-04-23 DIAGNOSIS — R310 Gross hematuria: Secondary | ICD-10-CM

## 2014-04-23 DIAGNOSIS — M542 Cervicalgia: Secondary | ICD-10-CM

## 2014-05-06 ENCOUNTER — Other Ambulatory Visit: Payer: Medicaid Other

## 2015-09-08 ENCOUNTER — Other Ambulatory Visit: Payer: Self-pay | Admitting: Family Medicine

## 2015-09-08 DIAGNOSIS — R945 Abnormal results of liver function studies: Principal | ICD-10-CM

## 2015-09-08 DIAGNOSIS — R7989 Other specified abnormal findings of blood chemistry: Secondary | ICD-10-CM

## 2015-09-16 ENCOUNTER — Ambulatory Visit
Admission: RE | Admit: 2015-09-16 | Discharge: 2015-09-16 | Disposition: A | Discharge status: Home / Self Care | Payer: BLUE CROSS/BLUE SHIELD | Source: Ambulatory Visit | Attending: Family Medicine | Admitting: Family Medicine

## 2015-09-16 DIAGNOSIS — R7989 Other specified abnormal findings of blood chemistry: Secondary | ICD-10-CM

## 2015-09-16 DIAGNOSIS — R945 Abnormal results of liver function studies: Principal | ICD-10-CM

## 2015-09-20 ENCOUNTER — Emergency Department (HOSPITAL_COMMUNITY): Payer: BLUE CROSS/BLUE SHIELD

## 2015-09-20 ENCOUNTER — Encounter (HOSPITAL_COMMUNITY): Payer: Self-pay | Admitting: Emergency Medicine

## 2015-09-20 ENCOUNTER — Emergency Department (HOSPITAL_COMMUNITY)
Admission: EM | Admit: 2015-09-20 | Discharge: 2015-09-20 | Disposition: A | Discharge status: Home / Self Care | Payer: BLUE CROSS/BLUE SHIELD | Attending: Emergency Medicine | Admitting: Emergency Medicine

## 2015-09-20 DIAGNOSIS — R0781 Pleurodynia: Secondary | ICD-10-CM | POA: Diagnosis not present

## 2015-09-20 DIAGNOSIS — M546 Pain in thoracic spine: Secondary | ICD-10-CM | POA: Diagnosis not present

## 2015-09-20 DIAGNOSIS — Y939 Activity, unspecified: Secondary | ICD-10-CM | POA: Insufficient documentation

## 2015-09-20 DIAGNOSIS — Z859 Personal history of malignant neoplasm, unspecified: Secondary | ICD-10-CM | POA: Insufficient documentation

## 2015-09-20 DIAGNOSIS — Y999 Unspecified external cause status: Secondary | ICD-10-CM | POA: Insufficient documentation

## 2015-09-20 DIAGNOSIS — R079 Chest pain, unspecified: Secondary | ICD-10-CM | POA: Diagnosis not present

## 2015-09-20 DIAGNOSIS — S161XXA Strain of muscle, fascia and tendon at neck level, initial encounter: Secondary | ICD-10-CM | POA: Diagnosis not present

## 2015-09-20 DIAGNOSIS — S199XXA Unspecified injury of neck, initial encounter: Secondary | ICD-10-CM | POA: Diagnosis present

## 2015-09-20 DIAGNOSIS — Y92410 Unspecified street and highway as the place of occurrence of the external cause: Secondary | ICD-10-CM | POA: Insufficient documentation

## 2015-09-20 MED ORDER — NAPROXEN 500 MG PO TABS: 500.0000 mg | ORAL_TABLET | Freq: Two times a day (BID) | ORAL | Status: DC

## 2015-09-20 MED ORDER — CYCLOBENZAPRINE HCL 10 MG PO TABS: 10.0000 mg | ORAL_TABLET | Freq: Two times a day (BID) | ORAL | Status: DC | PRN

## 2015-09-20 MED ORDER — IBUPROFEN 400 MG PO TABS
800.0000 mg | ORAL_TABLET | Freq: Once | ORAL | Status: AC
  Administered 2015-09-20: 800 mg via ORAL
  Filled 2015-09-20: qty 2
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
## 2015-09-20 NOTE — Discharge Instructions (Signed)

## 2015-09-20 NOTE — ED Notes (Signed)
Patient verbalized understanding of discharge instructions and denies any further needs or questions at this time. VS stable. Patient ambulatory with steady gait, declined wheelchair - escorted to ED entrance.
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
## 2016-04-05 ENCOUNTER — Emergency Department (HOSPITAL_BASED_OUTPATIENT_CLINIC_OR_DEPARTMENT_OTHER)
Admission: EM | Admit: 2016-04-05 | Discharge: 2016-04-05 | Disposition: A | Discharge status: Home / Self Care | Payer: BLUE CROSS/BLUE SHIELD | Attending: Emergency Medicine | Admitting: Emergency Medicine

## 2016-04-05 ENCOUNTER — Encounter (HOSPITAL_BASED_OUTPATIENT_CLINIC_OR_DEPARTMENT_OTHER): Payer: Self-pay | Admitting: *Deleted

## 2016-04-05 ENCOUNTER — Emergency Department (HOSPITAL_BASED_OUTPATIENT_CLINIC_OR_DEPARTMENT_OTHER): Payer: BLUE CROSS/BLUE SHIELD

## 2016-04-05 DIAGNOSIS — Y92410 Unspecified street and highway as the place of occurrence of the external cause: Secondary | ICD-10-CM | POA: Insufficient documentation

## 2016-04-05 DIAGNOSIS — S161XXA Strain of muscle, fascia and tendon at neck level, initial encounter: Secondary | ICD-10-CM | POA: Insufficient documentation

## 2016-04-05 DIAGNOSIS — S199XXA Unspecified injury of neck, initial encounter: Secondary | ICD-10-CM | POA: Diagnosis present

## 2016-04-05 DIAGNOSIS — Y9389 Activity, other specified: Secondary | ICD-10-CM | POA: Diagnosis not present

## 2016-04-05 DIAGNOSIS — Y999 Unspecified external cause status: Secondary | ICD-10-CM | POA: Insufficient documentation

## 2016-04-05 DIAGNOSIS — S8002XA Contusion of left knee, initial encounter: Secondary | ICD-10-CM | POA: Diagnosis not present

## 2016-04-05 DIAGNOSIS — Z791 Long term (current) use of non-steroidal anti-inflammatories (NSAID): Secondary | ICD-10-CM | POA: Insufficient documentation

## 2016-04-05 MED ORDER — CYCLOBENZAPRINE HCL 10 MG PO TABS: 10.0000 mg | ORAL_TABLET | Freq: Two times a day (BID) | ORAL | 0 refills | Status: DC | PRN

## 2016-04-05 MED FILL — CYCLOBENZAPRINE 10 MG TAB: 10 | 6 days supply | Qty: 12 | Fill #0
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
## 2016-04-05 NOTE — ED Notes (Signed)
Patient transported to X-ray 

## 2016-04-05 NOTE — ED Notes (Signed)
ED Provider at bedside. 

## 2016-04-28 ENCOUNTER — Other Ambulatory Visit: Payer: Self-pay | Admitting: Neurosurgery

## 2016-04-28 DIAGNOSIS — IMO0002 Reserved for concepts with insufficient information to code with codable children: Secondary | ICD-10-CM

## 2016-04-28 DIAGNOSIS — Z77018 Contact with and (suspected) exposure to other hazardous metals: Secondary | ICD-10-CM

## 2016-05-01 ENCOUNTER — Ambulatory Visit
Admission: RE | Admit: 2016-05-01 | Discharge: 2016-05-01 | Disposition: A | Discharge status: Home / Self Care | Payer: BLUE CROSS/BLUE SHIELD | Source: Ambulatory Visit | Attending: Neurosurgery | Admitting: Neurosurgery

## 2016-05-01 ENCOUNTER — Other Ambulatory Visit: Payer: BLUE CROSS/BLUE SHIELD

## 2016-05-01 DIAGNOSIS — Z77018 Contact with and (suspected) exposure to other hazardous metals: Secondary | ICD-10-CM

## 2016-05-04 ENCOUNTER — Ambulatory Visit
Admission: RE | Admit: 2016-05-04 | Discharge: 2016-05-04 | Disposition: A | Discharge status: Home / Self Care | Payer: BLUE CROSS/BLUE SHIELD | Source: Ambulatory Visit | Attending: Neurosurgery | Admitting: Neurosurgery

## 2016-05-04 DIAGNOSIS — M4802 Spinal stenosis, cervical region: Secondary | ICD-10-CM | POA: Diagnosis not present

## 2016-05-04 DIAGNOSIS — IMO0002 Reserved for concepts with insufficient information to code with codable children: Secondary | ICD-10-CM

## 2016-05-10 DIAGNOSIS — M25511 Pain in right shoulder: Secondary | ICD-10-CM | POA: Diagnosis not present

## 2016-05-10 DIAGNOSIS — Z6834 Body mass index (BMI) 34.0-34.9, adult: Secondary | ICD-10-CM | POA: Diagnosis not present

## 2016-05-10 DIAGNOSIS — R030 Elevated blood-pressure reading, without diagnosis of hypertension: Secondary | ICD-10-CM | POA: Diagnosis not present

## 2016-05-11 DIAGNOSIS — M542 Cervicalgia: Secondary | ICD-10-CM | POA: Diagnosis not present

## 2016-05-11 DIAGNOSIS — M25562 Pain in left knee: Secondary | ICD-10-CM | POA: Diagnosis not present

## 2016-05-11 DIAGNOSIS — M7541 Impingement syndrome of right shoulder: Secondary | ICD-10-CM | POA: Diagnosis not present

## 2016-05-11 DIAGNOSIS — M25511 Pain in right shoulder: Secondary | ICD-10-CM | POA: Diagnosis not present

## 2016-05-11 DIAGNOSIS — M1710 Unilateral primary osteoarthritis, unspecified knee: Secondary | ICD-10-CM | POA: Diagnosis not present

## 2016-05-11 DIAGNOSIS — Z6835 Body mass index (BMI) 35.0-35.9, adult: Secondary | ICD-10-CM | POA: Diagnosis not present

## 2016-05-12 ENCOUNTER — Other Ambulatory Visit: Payer: Self-pay | Admitting: Neurosurgery

## 2016-05-12 DIAGNOSIS — M25511 Pain in right shoulder: Secondary | ICD-10-CM

## 2016-05-15 ENCOUNTER — Other Ambulatory Visit: Payer: Self-pay | Admitting: Neurosurgery

## 2016-05-15 ENCOUNTER — Ambulatory Visit
Admission: RE | Admit: 2016-05-15 | Discharge: 2016-05-15 | Disposition: A | Discharge status: Home / Self Care | Payer: BLUE CROSS/BLUE SHIELD | Source: Ambulatory Visit | Attending: Neurosurgery | Admitting: Neurosurgery

## 2016-05-15 DIAGNOSIS — S4991XA Unspecified injury of right shoulder and upper arm, initial encounter: Secondary | ICD-10-CM | POA: Diagnosis not present

## 2016-05-15 DIAGNOSIS — M25511 Pain in right shoulder: Secondary | ICD-10-CM

## 2016-05-16 DIAGNOSIS — S46011A Strain of muscle(s) and tendon(s) of the rotator cuff of right shoulder, initial encounter: Secondary | ICD-10-CM | POA: Diagnosis not present

## 2016-05-16 DIAGNOSIS — S43431A Superior glenoid labrum lesion of right shoulder, initial encounter: Secondary | ICD-10-CM | POA: Diagnosis not present

## 2016-05-19 DIAGNOSIS — M75101 Unspecified rotator cuff tear or rupture of right shoulder, not specified as traumatic: Secondary | ICD-10-CM | POA: Diagnosis not present

## 2016-05-19 DIAGNOSIS — I10 Essential (primary) hypertension: Secondary | ICD-10-CM | POA: Diagnosis not present

## 2016-05-19 DIAGNOSIS — Z6835 Body mass index (BMI) 35.0-35.9, adult: Secondary | ICD-10-CM | POA: Diagnosis not present

## 2016-05-31 DIAGNOSIS — E119 Type 2 diabetes mellitus without complications: Secondary | ICD-10-CM | POA: Diagnosis not present

## 2016-05-31 DIAGNOSIS — R739 Hyperglycemia, unspecified: Secondary | ICD-10-CM | POA: Diagnosis not present

## 2016-05-31 DIAGNOSIS — Z6835 Body mass index (BMI) 35.0-35.9, adult: Secondary | ICD-10-CM | POA: Diagnosis not present

## 2016-05-31 DIAGNOSIS — I10 Essential (primary) hypertension: Secondary | ICD-10-CM | POA: Diagnosis not present
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
## 2016-06-09 NOTE — Pre-Procedure Instructions (Signed)
David Gross  06/09/2016      [place], [address] [place] [address] Phone: [phone] Fax: [phone]    Your procedure is scheduled on March 16  Report to [facility] at 1245 A.M.  Call this number if you have problems the morning of surgery:  [phone]   Remember:  Do not eat food or drink liquids after midnight.   Take these medicines the morning of surgery with A SIP OF WATER cyclobenzaprine (FLEXERIL), diltiazem (CARDIZEM CD), omeprazole (PRILOSEC), SOTALOL AF  PLEASE FOLLOW DOCTOR INSTRUCTIONS REGUARDING rivaroxaban (XARELTO)  7 days prior to surgery STOP taking any Aspirin, Aleve, Naproxen, Ibuprofen, Motrin, Advil, Goody's, BC's, all herbal medications, fish oil, and all vitamins    Do not wear jewelry.  Do not wear lotions, powders, or cologne, or deoderant.  Men may shave face and neck.  Do not bring valuables to the hospital.  [facility] is not responsible for any belongings or valuables.  Contacts, dentures or bridgework may not be worn into surgery.  Leave your suitcase in the car.  After surgery it may be brought to your room.  For patients admitted to the hospital, discharge time will be determined by your treatment team.  Patients discharged the day of surgery will not be allowed to drive home.    Special instructions:   [facility]- Preparing For Surgery  Before surgery, you can play an important role. Because skin is not sterile, your skin needs to be as free of germs as possible. You can reduce the number of germs on your skin by washing with CHG (chlorahexidine gluconate) Soap before surgery.  CHG is an antiseptic cleaner which kills germs and bonds with the skin to continue killing germs even after washing.  Please do not use if you have an allergy to CHG or antibacterial soaps. If your skin becomes reddened/irritated stop using the CHG.  Do  not shave (including legs and underarms) for at least 48 hours prior to first CHG shower. It is OK to shave your face.  Please follow these instructions carefully.   1. Shower the NIGHT BEFORE SURGERY and the MORNING OF SURGERY with CHG.   2. If you chose to wash your hair, wash your hair first as usual with your normal shampoo.  3. After you shampoo, rinse your hair and body thoroughly to remove the shampoo.  4. Use CHG as you would any other liquid soap. You can apply CHG directly to the skin and wash gently with a scrungie or a clean washcloth.   5. Apply the CHG Soap to your body ONLY FROM THE NECK DOWN.  Do not use on open wounds or open sores. Avoid contact with your eyes, ears, mouth and genitals (private parts). Wash genitals (private parts) with your normal soap.  6. Wash thoroughly, paying special attention to the area where your surgery will be performed.  7. Thoroughly rinse your body with warm water from the neck down.  8. DO NOT shower/wash with your normal soap after using and rinsing off the CHG Soap.  9. Pat yourself dry with a CLEAN TOWEL.   10. Wear CLEAN PAJAMAS   11. Place CLEAN SHEETS on your bed the night of your first shower and DO NOT SLEEP WITH PETS.    Day of Surgery: Do not apply any deodorants/lotions. Please wear clean clothes to the hospital/surgery center.  Please read over the following fact sheets that you were given.

## 2016-06-12 ENCOUNTER — Encounter (HOSPITAL_COMMUNITY): Payer: Self-pay

## 2016-06-12 ENCOUNTER — Encounter (HOSPITAL_COMMUNITY)
Admission: RE | Admit: 2016-06-12 | Discharge: 2016-06-12 | Disposition: A | Discharge status: Home / Self Care | Payer: BLUE CROSS/BLUE SHIELD | Source: Ambulatory Visit | Attending: Orthopedic Surgery | Admitting: Orthopedic Surgery

## 2016-06-12 DIAGNOSIS — Z01812 Encounter for preprocedural laboratory examination: Secondary | ICD-10-CM | POA: Insufficient documentation

## 2016-06-12 HISTORY — DX: Gastro-esophageal reflux disease without esophagitis: K21.9

## 2016-06-12 HISTORY — DX: Essential (primary) hypertension: I10

## 2016-06-12 LAB — PROTIME-INR
INR: 0.93
PROTHROMBIN TIME: 12.4 s (ref 11.4–15.2)

## 2016-06-12 LAB — CBC
HCT: 51.7 % (ref 39.0–52.0)
HEMOGLOBIN: 17.8 g/dL — AB (ref 13.0–17.0)
MCH: 31 pg (ref 26.0–34.0)
MCHC: 34.4 g/dL (ref 30.0–36.0)
MCV: 90.1 fL (ref 78.0–100.0)
Platelets: 180 10*3/uL (ref 150–400)
RBC: 5.74 MIL/uL (ref 4.22–5.81)
RDW: 13.3 % (ref 11.5–15.5)
WBC: 5 10*3/uL (ref 4.0–10.5)

## 2016-06-12 LAB — BASIC METABOLIC PANEL
ANION GAP: 9 (ref 5–15)
BUN: 21 mg/dL — ABNORMAL HIGH (ref 6–20)
CALCIUM: 9.8 mg/dL (ref 8.9–10.3)
CO2: 25 mmol/L (ref 22–32)
Chloride: 103 mmol/L (ref 101–111)
Creatinine, Ser: 1.31 mg/dL — ABNORMAL HIGH (ref 0.61–1.24)
Glucose, Bld: 99 mg/dL (ref 65–99)
Potassium: 4.8 mmol/L (ref 3.5–5.1)
Sodium: 137 mmol/L (ref 135–145)

## 2016-06-12 NOTE — Progress Notes (Signed)
PCP - Benjamine Mola Dewy  Cardiologist - Ganji  Chest x-ray - not needed EKG - requesting Stress Test - requesting ECHO - requesting Cardiac Cath - denies   Sending to anesthesia for review of cardiac records Last dose of Xarelto 06/05/16   Patient denies shortness of breath, fever, cough and chest pain at PAT appointment   Patient verbalized understanding of instructions that was given to them at the PAT appointment. Patient expressed that there were no further questions.  Patient was also instructed that they will need to review over the PAT instructions again at home before the surgery.

## 2016-06-13 NOTE — Progress Notes (Signed)
Anesthesia Chart Review:  Pt is a 51 year old male scheduled for R shoulder arthroscopy, subacromial decompression, mini-open rotator cuff repair, biceps tenodesis on 06/16/2016 with Netta Cedars, MD  - Cardiologist is Kela Millin, MD, last office visit 05/01/16.  - PCP is Rachell Cipro, MD.   PMH includes:  Atrial fibrillation, HTN, hyperlipidemia, bone cancer, GERD.  Never smoker. BMI 33.5  Medications include: diltiazem, losartan, prilosec, xarelto, sotalol. Last dose xarelto 06/05/16.   Preoperative labs reviewed.    CXR 09/20/15: Chronic subsegmental atelectasis LEFT base.  Otherwise negative exam.  EKG 05/01/16: Sinus bradycardia (52 bpm). Poor R wave progression.   Echo 05/26/15 [facility] cardiovascular): 1. LV cavity normal in size. Mild LVH. Normal global wall motion. Normal diastolic filling pattern. Gated EF 55%. 2. LA normal in size. 3. IVC is normal with blunted respiratory response. This suggests elevated right heart pressure.  Treadmill stress test [facility] Cardiovascular): 10.11 METS. Normal BP response. No ST-T changes of ischemia with exercise stress test.  No arrhythmias notes. No afib. Stress terminated due to shortness of breath and THR >85% met. Excellent effort.   Cardiac event monitor 05/20/15 [facility]): Atrial fibrillation with controlled ventricular response, sustained 2 episodes in 30 days.   If no changes, I anticipate pt can proceed with surgery as scheduled.   Willeen Cass, FNP-BC [facility]/Anesthesiology Phone: [phone] 06/13/2016 3:15 PM

## 2016-06-14 DIAGNOSIS — I480 Paroxysmal atrial fibrillation: Secondary | ICD-10-CM | POA: Diagnosis not present

## 2016-06-14 DIAGNOSIS — I10 Essential (primary) hypertension: Secondary | ICD-10-CM | POA: Diagnosis not present

## 2016-06-15 MED ORDER — CEFAZOLIN SODIUM 10 G IJ SOLR
3.0000 g | INTRAMUSCULAR | Status: AC
  Administered 2016-06-16: 3 g via INTRAVENOUS
  Filled 2016-06-15: qty 3000

## 2016-06-16 ENCOUNTER — Ambulatory Visit (HOSPITAL_COMMUNITY): Payer: BLUE CROSS/BLUE SHIELD | Admitting: Vascular Surgery

## 2016-06-16 ENCOUNTER — Ambulatory Visit (HOSPITAL_COMMUNITY): Payer: BLUE CROSS/BLUE SHIELD | Admitting: Anesthesiology

## 2016-06-16 ENCOUNTER — Encounter (HOSPITAL_COMMUNITY): Payer: Self-pay | Admitting: Anesthesiology

## 2016-06-16 ENCOUNTER — Encounter (HOSPITAL_COMMUNITY)
Admission: RE | Disposition: A | Discharge status: Home / Self Care | Payer: Self-pay | Source: Ambulatory Visit | Attending: Orthopedic Surgery

## 2016-06-16 ENCOUNTER — Ambulatory Visit (HOSPITAL_COMMUNITY)
Admission: RE | Admit: 2016-06-16 | Discharge: 2016-06-16 | Disposition: A | Discharge status: Home / Self Care | Payer: BLUE CROSS/BLUE SHIELD | Source: Ambulatory Visit | Attending: Orthopedic Surgery | Admitting: Orthopedic Surgery

## 2016-06-16 DIAGNOSIS — Z79899 Other long term (current) drug therapy: Secondary | ICD-10-CM | POA: Insufficient documentation

## 2016-06-16 DIAGNOSIS — Z9889 Other specified postprocedural states: Secondary | ICD-10-CM | POA: Insufficient documentation

## 2016-06-16 DIAGNOSIS — I4891 Unspecified atrial fibrillation: Secondary | ICD-10-CM | POA: Insufficient documentation

## 2016-06-16 DIAGNOSIS — Z87828 Personal history of other (healed) physical injury and trauma: Secondary | ICD-10-CM | POA: Diagnosis not present

## 2016-06-16 DIAGNOSIS — E7800 Pure hypercholesterolemia, unspecified: Secondary | ICD-10-CM | POA: Insufficient documentation

## 2016-06-16 DIAGNOSIS — S43431A Superior glenoid labrum lesion of right shoulder, initial encounter: Secondary | ICD-10-CM | POA: Insufficient documentation

## 2016-06-16 DIAGNOSIS — I10 Essential (primary) hypertension: Secondary | ICD-10-CM | POA: Diagnosis not present

## 2016-06-16 DIAGNOSIS — Z888 Allergy status to other drugs, medicaments and biological substances status: Secondary | ICD-10-CM | POA: Diagnosis not present

## 2016-06-16 DIAGNOSIS — M94211 Chondromalacia, right shoulder: Secondary | ICD-10-CM | POA: Diagnosis not present

## 2016-06-16 DIAGNOSIS — Z791 Long term (current) use of non-steroidal anti-inflammatories (NSAID): Secondary | ICD-10-CM | POA: Insufficient documentation

## 2016-06-16 DIAGNOSIS — Z7901 Long term (current) use of anticoagulants: Secondary | ICD-10-CM | POA: Diagnosis not present

## 2016-06-16 DIAGNOSIS — M75101 Unspecified rotator cuff tear or rupture of right shoulder, not specified as traumatic: Secondary | ICD-10-CM | POA: Diagnosis not present

## 2016-06-16 DIAGNOSIS — K219 Gastro-esophageal reflux disease without esophagitis: Secondary | ICD-10-CM | POA: Insufficient documentation

## 2016-06-16 DIAGNOSIS — X58XXXA Exposure to other specified factors, initial encounter: Secondary | ICD-10-CM | POA: Insufficient documentation

## 2016-06-16 DIAGNOSIS — G8918 Other acute postprocedural pain: Secondary | ICD-10-CM | POA: Diagnosis not present

## 2016-06-16 DIAGNOSIS — Z5333 Arthroscopic surgical procedure converted to open procedure: Secondary | ICD-10-CM | POA: Diagnosis not present

## 2016-06-16 DIAGNOSIS — S46111A Strain of muscle, fascia and tendon of long head of biceps, right arm, initial encounter: Secondary | ICD-10-CM | POA: Diagnosis not present

## 2016-06-16 HISTORY — PX: SHOULDER OPEN ROTATOR CUFF REPAIR: SHX2407

## 2016-06-16 HISTORY — DX: Family history of other specified conditions: Z84.89

## 2016-06-16 SURGERY — REPAIR, ROTATOR CUFF, OPEN
Anesthesia: General | Site: Shoulder | Laterality: Right

## 2016-06-16 MED ORDER — MEPERIDINE HCL 25 MG/ML IJ SOLN: 6.2500 mg | INTRAMUSCULAR | Status: DC | PRN

## 2016-06-16 MED ORDER — ONDANSETRON HCL 4 MG/2ML IJ SOLN
INTRAMUSCULAR | Status: AC
  Administered 2016-06-16: 4 mg via INTRAVENOUS
  Filled 2016-06-16: qty 2

## 2016-06-16 MED ORDER — LIDOCAINE HCL (CARDIAC) 20 MG/ML IV SOLN
INTRAVENOUS | Status: DC | PRN
  Administered 2016-06-16: 80 mg via INTRAVENOUS

## 2016-06-16 MED ORDER — KETOROLAC TROMETHAMINE 10 MG PO TABS: 10.0000 mg | ORAL_TABLET | Freq: Three times a day (TID) | ORAL | 0 refills | Status: AC

## 2016-06-16 MED ORDER — PROMETHAZINE HCL 25 MG PO TABS: 25.0000 mg | ORAL_TABLET | Freq: Four times a day (QID) | ORAL | 0 refills | Status: AC | PRN

## 2016-06-16 MED ORDER — SUGAMMADEX SODIUM 200 MG/2ML IV SOLN
INTRAVENOUS | Status: DC | PRN
  Administered 2016-06-16: 200 mg via INTRAVENOUS

## 2016-06-16 MED ORDER — PHENYLEPHRINE HCL 10 MG/ML IJ SOLN
INTRAVENOUS | Status: DC | PRN
  Administered 2016-06-16: 15 ug/min via INTRAVENOUS
  Administered 2016-06-16: 25 ug/min via INTRAVENOUS
  Administered 2016-06-16: 50 ug/min via INTRAVENOUS
  Administered 2016-06-16: 75 ug/min via INTRAVENOUS

## 2016-06-16 MED ORDER — BUPIVACAINE-EPINEPHRINE (PF) 0.5% -1:200000 IJ SOLN
INTRAMUSCULAR | Status: DC | PRN
  Administered 2016-06-16: 10 mL

## 2016-06-16 MED ORDER — BUPIVACAINE HCL (PF) 0.25 % IJ SOLN
INTRAMUSCULAR | Status: DC | PRN
  Administered 2016-06-16: 10 mL

## 2016-06-16 MED ORDER — BUPIVACAINE HCL (PF) 0.25 % IJ SOLN
INTRAMUSCULAR | Status: AC
  Filled 2016-06-16: qty 30

## 2016-06-16 MED ORDER — ROCURONIUM BROMIDE 100 MG/10ML IV SOLN
INTRAVENOUS | Status: DC | PRN
  Administered 2016-06-16: 40 mg via INTRAVENOUS

## 2016-06-16 MED ORDER — PROPOFOL 10 MG/ML IV BOLUS
INTRAVENOUS | Status: AC
  Filled 2016-06-16: qty 20

## 2016-06-16 MED ORDER — 0.9 % SODIUM CHLORIDE (POUR BTL) OPTIME
TOPICAL | Status: DC | PRN
  Administered 2016-06-16: 1000 mL

## 2016-06-16 MED ORDER — LIDOCAINE 2% (20 MG/ML) 5 ML SYRINGE
INTRAMUSCULAR | Status: AC
  Filled 2016-06-16: qty 5

## 2016-06-16 MED ORDER — FENTANYL CITRATE (PF) 100 MCG/2ML IJ SOLN
INTRAMUSCULAR | Status: AC
  Filled 2016-06-16: qty 2

## 2016-06-16 MED ORDER — BUPIVACAINE-EPINEPHRINE (PF) 0.25% -1:200000 IJ SOLN
INTRAMUSCULAR | Status: DC | PRN
Start: 2016-06-16 — End: 2016-06-16
  Administered 2016-06-16: 6 mL

## 2016-06-16 MED ORDER — SUGAMMADEX SODIUM 200 MG/2ML IV SOLN
INTRAVENOUS | Status: AC
  Filled 2016-06-16: qty 4

## 2016-06-16 MED ORDER — PROPOFOL 10 MG/ML IV BOLUS
INTRAVENOUS | Status: DC | PRN
  Administered 2016-06-16: 200 mg via INTRAVENOUS

## 2016-06-16 MED ORDER — SODIUM CHLORIDE 0.9 % IR SOLN
Status: DC | PRN
  Administered 2016-06-16: 6000 mL

## 2016-06-16 MED ORDER — HYDROMORPHONE HCL 1 MG/ML IJ SOLN
0.2500 mg | INTRAMUSCULAR | Status: DC | PRN
  Administered 2016-06-16 (×2): 0.5 mg via INTRAVENOUS

## 2016-06-16 MED ORDER — EPHEDRINE SULFATE 50 MG/ML IJ SOLN
INTRAMUSCULAR | Status: DC | PRN
  Administered 2016-06-16: 5 mg via INTRAVENOUS

## 2016-06-16 MED ORDER — HYDROMORPHONE HCL 1 MG/ML IJ SOLN
INTRAMUSCULAR | Status: AC
  Administered 2016-06-16: 0.5 mg via INTRAVENOUS
  Filled 2016-06-16: qty 1

## 2016-06-16 MED ORDER — LACTATED RINGERS IV SOLN
INTRAVENOUS | Status: DC
  Administered 2016-06-16 (×3): via INTRAVENOUS

## 2016-06-16 MED ORDER — OXYCODONE-ACETAMINOPHEN 7.5-325 MG PO TABS: 1.0000 | ORAL_TABLET | ORAL | 0 refills | Status: AC | PRN

## 2016-06-16 MED ORDER — HYDROMORPHONE HCL 1 MG/ML IJ SOLN
INTRAMUSCULAR | Status: AC
  Filled 2016-06-16: qty 0.5

## 2016-06-16 MED ORDER — ONDANSETRON HCL 4 MG/2ML IJ SOLN
4.0000 mg | Freq: Once | INTRAMUSCULAR | Status: AC | PRN
  Administered 2016-06-16: 4 mg via INTRAVENOUS

## 2016-06-16 MED ORDER — PHENYLEPHRINE 40 MCG/ML (10ML) SYRINGE FOR IV PUSH (FOR BLOOD PRESSURE SUPPORT)
PREFILLED_SYRINGE | INTRAVENOUS | Status: AC
  Filled 2016-06-16: qty 10

## 2016-06-16 MED ORDER — METHOCARBAMOL 750 MG PO TABS: 750.0000 mg | ORAL_TABLET | Freq: Four times a day (QID) | ORAL | 1 refills | Status: AC | PRN

## 2016-06-16 MED ORDER — ONDANSETRON HCL 4 MG/2ML IJ SOLN
INTRAMUSCULAR | Status: DC | PRN
  Administered 2016-06-16: 4 mg via INTRAVENOUS

## 2016-06-16 MED ORDER — PROPOFOL 10 MG/ML IV BOLUS
INTRAVENOUS | Status: AC
Start: 2016-06-16 — End: 2016-06-16
  Filled 2016-06-16: qty 20

## 2016-06-16 MED ORDER — MIDAZOLAM HCL 2 MG/2ML IJ SOLN
INTRAMUSCULAR | Status: AC
  Administered 2016-06-16: 1 mg
  Filled 2016-06-16: qty 2

## 2016-06-16 MED ORDER — ROCURONIUM BROMIDE 50 MG/5ML IV SOSY
PREFILLED_SYRINGE | INTRAVENOUS | Status: AC
  Filled 2016-06-16: qty 5

## 2016-06-16 MED ORDER — EPINEPHRINE PF 1 MG/ML IJ SOLN
INTRAMUSCULAR | Status: AC
  Filled 2016-06-16: qty 1

## 2016-06-16 MED ORDER — FENTANYL CITRATE (PF) 100 MCG/2ML IJ SOLN
INTRAMUSCULAR | Status: DC | PRN
  Administered 2016-06-16: 100 ug via INTRAVENOUS

## 2016-06-16 MED ORDER — CHLORHEXIDINE GLUCONATE 4 % EX LIQD: 60.0000 mL | Freq: Once | CUTANEOUS | Status: DC

## 2016-06-16 MED ORDER — FENTANYL CITRATE (PF) 100 MCG/2ML IJ SOLN
INTRAMUSCULAR | Status: AC
  Administered 2016-06-16: 50 ug
  Filled 2016-06-16: qty 2

## 2016-06-16 SURGICAL SUPPLY — 62 items
ANCHOR ALL- SUT RC 2 SUT Y-K (Anchor) ×4 IMPLANT
ANCHOR ALL-SUT FLEX 1.3 Y-KNOT (Anchor) ×3 IMPLANT
ANCHOR ALL-SUT RC 2 SUT Y-K (Anchor) ×2 IMPLANT
BIT DRILL 1.3M DISPOSABLE (BIT) ×3 IMPLANT
BIT DRILL 7/64X5 DISP (BIT) IMPLANT
BIT DRILL YKNOT HARD BONE 1.3 (BIT) IMPLANT
BLADE AVERAGE 25MMX9MM (BLADE)
BLADE AVERAGE 25X9 (BLADE) IMPLANT
BNDG COHESIVE 4X5 TAN STRL (GAUZE/BANDAGES/DRESSINGS) ×3 IMPLANT
CLEANER TIP ELECTROSURG 2X2 (MISCELLANEOUS) IMPLANT
CLOSURE WOUND 1/2 X4 (GAUZE/BANDAGES/DRESSINGS) ×1
COVER SURGICAL LIGHT HANDLE (MISCELLANEOUS) ×3 IMPLANT
DECANTER SPIKE VIAL GLASS SM (MISCELLANEOUS) IMPLANT
DRAPE IMP U-DRAPE 54X76 (DRAPES) ×3 IMPLANT
DRAPE INCISE IOBAN 66X45 STRL (DRAPES) IMPLANT
DRAPE ORTHO SPLIT 77X108 STRL (DRAPES) ×4
DRAPE STERI 35X30 U-POUCH (DRAPES) ×3 IMPLANT
DRAPE SURG ORHT 6 SPLT 77X108 (DRAPES) ×2 IMPLANT
DRAPE U-SHAPE 47X51 STRL (DRAPES) ×3 IMPLANT
DRSG EMULSION OIL 3X3 NADH (GAUZE/BANDAGES/DRESSINGS) ×3 IMPLANT
DRSG PAD ABDOMINAL 8X10 ST (GAUZE/BANDAGES/DRESSINGS) ×3 IMPLANT
DURAPREP 26ML APPLICATOR (WOUND CARE) ×3 IMPLANT
ELECT REM PT RETURN 9FT ADLT (ELECTROSURGICAL) ×3
ELECTRODE REM PT RTRN 9FT ADLT (ELECTROSURGICAL) ×1 IMPLANT
FACESHIELD WRAPAROUND (MASK) IMPLANT
FILTER STRAW FLUID ASPIR (MISCELLANEOUS) ×3 IMPLANT
GAUZE SPONGE 4X4 12PLY STRL (GAUZE/BANDAGES/DRESSINGS) ×3 IMPLANT
GLOVE BIOGEL PI ORTHO PRO 7.5 (GLOVE) ×2
GLOVE BIOGEL PI ORTHO PRO SZ8 (GLOVE) ×2
GLOVE ORTHO TXT STRL SZ7.5 (GLOVE) ×3 IMPLANT
GLOVE PI ORTHO PRO STRL 7.5 (GLOVE) ×1 IMPLANT
GLOVE PI ORTHO PRO STRL SZ8 (GLOVE) ×1 IMPLANT
GLOVE SURG ORTHO 8.5 STRL (GLOVE) ×3 IMPLANT
GOWN STRL REUS W/ TWL LRG LVL3 (GOWN DISPOSABLE) ×1 IMPLANT
GOWN STRL REUS W/ TWL XL LVL3 (GOWN DISPOSABLE) ×2 IMPLANT
GOWN STRL REUS W/TWL LRG LVL3 (GOWN DISPOSABLE) ×2
GOWN STRL REUS W/TWL XL LVL3 (GOWN DISPOSABLE) ×4
KIT BASIN OR (CUSTOM PROCEDURE TRAY) ×3 IMPLANT
KIT ROOM TURNOVER OR (KITS) ×3 IMPLANT
MANIFOLD NEPTUNE II (INSTRUMENTS) ×3 IMPLANT
NDL SUT .5 MAYO 1.404X.05X (NEEDLE) ×1 IMPLANT
NDL SUT 6 .5 CRC .975X.05 MAYO (NEEDLE) ×1 IMPLANT
NEEDLE 18GX1X1/2 (RX/OR ONLY) (NEEDLE) ×3 IMPLANT
NEEDLE HYPO 25GX1X1/2 BEV (NEEDLE) ×3 IMPLANT
NEEDLE MAYO TAPER (NEEDLE) ×4
NS IRRIG 1000ML POUR BTL (IV SOLUTION) ×3 IMPLANT
PACK SHOULDER (CUSTOM PROCEDURE TRAY) ×3 IMPLANT
PACK UNIVERSAL I (CUSTOM PROCEDURE TRAY) IMPLANT
PAD ARMBOARD 7.5X6 YLW CONV (MISCELLANEOUS) ×6 IMPLANT
PROBE BIPOLAR ATHRO 135MM 90D (MISCELLANEOUS) ×3 IMPLANT
SPONGE LAP 4X18 X RAY DECT (DISPOSABLE) IMPLANT
STRIP CLOSURE SKIN 1/2X4 (GAUZE/BANDAGES/DRESSINGS) ×2 IMPLANT
SUT HI-FI 2 STRAND C-2 40 (SUTURE) ×6 IMPLANT
SUT MNCRL AB 3-0 PS2 18 (SUTURE) ×3 IMPLANT
SUT VIC AB 2-0 CT1 27 (SUTURE) ×2
SUT VIC AB 2-0 CT1 TAPERPNT 27 (SUTURE) ×1 IMPLANT
SYR CONTROL 10ML LL (SYRINGE) ×3 IMPLANT
SYR TB 1ML LUER SLIP (SYRINGE) ×3 IMPLANT
TOWEL OR 17X24 6PK STRL BLUE (TOWEL DISPOSABLE) ×3 IMPLANT
TOWEL OR 17X26 10 PK STRL BLUE (TOWEL DISPOSABLE) ×3 IMPLANT
WATER STERILE IRR 1000ML POUR (IV SOLUTION) ×3 IMPLANT
YANKAUER SUCT BULB TIP NO VENT (SUCTIONS) ×3 IMPLANT

## 2016-06-16 NOTE — Anesthesia Postprocedure Evaluation (Addendum)
Anesthesia Post Note  Patient: David Gross  Procedure(s) Performed: Procedure(s) (LRB): Right shoulder arthroscopy, subacromial decompression, mini-open rotator cuff repair, biceps tenodesis (Right)  Patient location during evaluation: PACU Anesthesia Type: General and Regional Level of consciousness: awake and alert, patient cooperative and oriented Pain management: pain level controlled Vital Signs Assessment: post-procedure vital signs reviewed and stable Respiratory status: spontaneous breathing, nonlabored ventilation, respiratory function stable and patient connected to nasal cannula oxygen Cardiovascular status: blood pressure returned to baseline and stable Postop Assessment: no signs of nausea or vomiting Anesthetic complications: no       Last Vitals:  Vitals:   06/16/16 1710 06/16/16 1715  BP: (!) 146/89   Pulse: 69 65  Resp: 17 19  Temp:      Last Pain:  Vitals:   06/16/16 1656  PainSc: 8                  Alorah Mcree,E. Cleona Doubleday

## 2016-06-16 NOTE — Progress Notes (Signed)
Orthopedic Tech Progress Note Patient Details:  David Gross 15-Jun-1965 [id number]  Ortho Devices Type of Ortho Device: Shoulder abduction pillow   [place] Pink 06/16/2016, 3:31 PM

## 2016-06-16 NOTE — Op Note (Deleted)
  The note originally documented on this encounter has been moved the the encounter in which it belongs.  

## 2016-06-16 NOTE — OR Nursing (Signed)
Contacted Dr. Veverly Fells to clarify Toradol administration.  I then called Lysbeth Galas, Mr. Handshoe son, on his cell and instructed him to start the toradol prescription tonight.

## 2016-06-16 NOTE — Anesthesia Preprocedure Evaluation (Addendum)
Anesthesia Evaluation    Reviewed: Allergy & Precautions, Patient's Chart, lab work & pertinent test results, reviewed documented beta blocker date and time   Airway Mallampati: II  TM Distance: >3 FB Neck ROM: Full    Dental   Pulmonary neg pulmonary ROS,    breath sounds clear to auscultation       Cardiovascular hypertension, negative cardio ROS   Rhythm:Irregular Rate:Normal     Neuro/Psych negative neurological ROS     GI/Hepatic negative GI ROS, Neg liver ROS, GERD (occasional, taked meds prn)  Medicated,  Endo/Other  negative endocrine ROS  Renal/GU negative Renal ROS     Musculoskeletal negative musculoskeletal ROS (+)   Abdominal   Peds  Hematology negative hematology ROS (+)   Anesthesia Other Findings Day of surgery medications reviewed with the patient.  Reproductive/Obstetrics                            Anesthesia Physical Anesthesia Plan  ASA: III  Anesthesia Plan: General   Post-op Pain Management:  Regional for Post-op pain   Induction: Intravenous  Airway Management Planned: Oral ETT  Additional Equipment:   Intra-op Plan:   Post-operative Plan: Extubation in OR  Informed Consent: I have reviewed the patients History and Physical, chart, labs and discussed the procedure including the risks, benefits and alternatives for the proposed anesthesia with the patient or authorized representative who has indicated his/her understanding and acceptance.   Dental advisory given  Plan Discussed with: CRNA  Anesthesia Plan Comments: (See Kabbe FNP Anesthesia Note dated 06/13/16. EF 55%. Xarelto stopped 10 days ago.)       Anesthesia Quick Evaluation

## 2016-06-16 NOTE — Op Note (Signed)
NAME:  ROYALE, SWAMY NO.:  [id number]  MEDICAL RECORD NO.:  [id number]  LOCATION:                                 FACILITY:  PHYSICIAN:  [place] Veverly Fells, M.D. DATE OF BIRTH:  Jan 03, 1966  DATE OF PROCEDURE:  06/16/2016 DATE OF DISCHARGE:                              OPERATIVE REPORT   PREOPERATIVE DIAGNOSIS:  Right shoulder rotator cuff tear and SLAP tear.  POSTOPERATIVE DIAGNOSIS:  Right shoulder rotator cuff tear and SLAP tear.  PROCEDURE PERFORMED:  Right shoulder arthroscopy with extensive intra- articular debridement of torn superior labrum, anterior-posterior arthroscopic biceps tenotomy, arthroscopic subacromial decompression with CA ligament release, and mini-open rotator cuff repair and biceps tenodesis in the groove.  ATTENDING SURGEON:  Doran Heater. Veverly Fells, MD.  ASSISTANT:  Abbott Pao. Dixon, PA-C, who scrubbed the entire procedure and necessary for satisfactory completion of surgery.  ANESTHESIA:  General anesthesia was used plus interscalene block.  ESTIMATED BLOOD LOSS:  Minimal.  FLUID REPLACEMENT:  1200 mL crystalloid.  INSTRUMENT COUNTS:  Correct.  COMPLICATIONS:  There were no complications.  ANTIBIOTICS:  Perioperative antibiotics were given.  INDICATIONS:  The patient is a 51 year old male with progressively worsening right shoulder pain and dysfunction secondary to rotator cuff tear and SLAP tear.  MRI scan positive consistent with clinical examination.  The patient has failed an extensive period of conservative management and desires operative treatment to restore function and eliminate pain.  Informed consent was obtained.  DESCRIPTION OF PROCEDURE:  After an adequate level of anesthesia achieved, the patient was positioned in the modified beach-chair position.  Right shoulder correctly identified.  Time-out called.  We examined the patient's shoulder under anesthesia.  Passive range of motion is followed forward elevation of  140-150 degrees, abduction 90, external rotation 60, internal rotation to posterior belt line. Negative sulcus.  Negative anterior-posterior drawer.  Negative load shift.  After sterile prep and drape of the shoulder and arm, re- verified time-out, and then into the shoulder with standard arthroscopic portals including anterior, posterior, and lateral portals.  I identified significant tearing of the intra-articular portion of biceps and the superior labrum resulting in unstable biceps anchor, performed a biceps tenotomy, and labral debridement back to stable labral rim. Anterior-inferior, posterior-inferior labra were intact.  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  Subscapularis rolled edge normal.  Supraspinatus and infraspinatus torn.  Teres minor intact.  We reversed the scope looking posteriorly.  Again, teres minor looked good and the posterior labrum looked good.  We placed scope in subacromial space.  Through bursectomy, acromioplasty was performed, creating a type 1 acromial shape with a butcher-block technique utilizing high-speed bur.  We released the CA ligament.  Thorough decompression of rotator cuff outlet was achieved. We then went ahead and identified the rotator cuff tear from the bursal surface as well.  This appeared to be U-shaped tear.  We next went ahead and concluded the arthroscopic portion of procedure.  We made a small mini open incision, started at the anterior lip of the acromion staying distally about 3-4 cm in the raphe between the anterolateral heads of the deltoid.  We made our skin incision and then subcutaneous  dissection with needle-tip Bovie, identified the deltoid raphe and then incised that.  We placed Arthrex retractor, identified the biceps groove, delivered the biceps tendon out of the wound, placed #2 Hi-Fi suture in a whipstitch fashion through the biceps tendon, measuring to the appropriate  length.  We cut away the remaining biceps tendon.  We then prepared the floor of the biceps groove with a Soil scientist, removing the synovial lining, getting down to bone.  We placed a Y-Knot Flex anchor through the floor of the biceps groove and brought that up in a whipstitch fashion through the biceps tendon.  We then took the proximal sutures, whipstitch stitch sutures and put them through a soft tissue bridge proximally to further reinforce repair.  We then over sewed with 0 Vicryl figure-of-eight x3 to close down the soft tissue over the top of the biceps groove and incorporate the biceps tendon in that repair. Next, we addressed the rotator cuff tear, which was U shaped tear.  This involved the supraspinatus and the infraspinatus.  We placed a total of four #2 Hi-fi sutures in the free edge of the tendon full-thickness.  We mobilized the tendon with a cuff grasper and Cobb elevator.  Prior to placing this free edge sutures, we prepared the greater tuberosity, debriding that down to a nice flat footprint for the rotator cuff with a rongeur, and then we went ahead and placed 2 Y-Knot RC anchors at the articular margin of the greater tuberosity to restore the medial portion of footprint.  Those were double loaded with #2 Hi-Fi suture.  Placed those up in a mattress fashion through the medial portion of footprint. We took the lateral sutures before those and took them down through drill holes in the bone and tied over lateral bone bridge.  We had a double row repair, very strong, watertight.  Range of the shoulder, there was some tension with the patient's arm on this side, relieved with just 15 degrees or so of abduction.  So, we did go and decide to use a pillow abduction sling.  At this point, we irrigated thoroughly the subdeltoid subacromial space.  Range of the shoulder, no impingement noted.  We repaired the deltoid anatomically with 0 Vicryl suture followed by 2-0 Vicryl for  subcutaneous closure and 4-0 Monocryl for skin and portals.  Steri-Strips applied followed by sterile dressing. The patient tolerated surgery well.     Doran Heater. Veverly Fells, M.D.   ______________________________ Doran Heater. Veverly Fells, M.D.    SRN/MEDQ  D:  06/16/2016  T:  06/16/2016  Job:  [id number]

## 2016-06-16 NOTE — Discharge Instructions (Signed)
Regional Anesthesia, Care After Refer to this sheet in the next few weeks. These instructions provide you with information about caring for yourself after your procedure. Your health care provider may also give you more specific instructions. Your treatment has been planned according to current medical practices, but problems sometimes occur. Call your health care provider if you have any problems or questions after your procedure. What can I expect after the procedure? After the procedure, it is common to have:  Sleepiness.  Nausea.  Itching. Follow these instructions at home:  Take over-the-counter and prescription medicines only as told by your health care provider.  Do not drive, exercise, or do any other activities that require coordination for 24 hours or as told by your health care provider. Ask your health care provider when you can return to your usual activities.  Drink enough water to keep your urine clear or pale yellow.  If you had a bandage (dressing) placed over the injection site, only remove it when told to do so by your health care provider. Contact a health care provider if:  You continue to have nausea and vomiting for more than one day.  You develop a rash.  You have trouble urinating Get help right away if:  You have bleeding from the injection site or bleeding under the skin at the injection site.  You have redness, swelling, or pain around your injection site.  You have a fever.  You develop a headache.  You develop new numbness or weakness. This information is not intended to replace advice given to you by your health care provider. Make sure you discuss any questions you have with your health care provider. Document Released: 07/12/2015 Document Revised: 08/26/2015 Document Reviewed: 07/15/2014 Elsevier Interactive Patient Education  2017 [employer].  From Dr. Veverly Fells: David Gross to the right shoulder as much as you can.  Keep the incision bandaged  through the weekend with the sterile surgical bandage. Ok to change to Band Aids on Monday - leave Steri Strips on the skin and cover the incisions with Band Aids  Do exercises every hour for 5 minutes - remove sling by leaning to the right and then gently sliding the pillow sling out - replace with a pillow under the arm  Pendulum exercises - dangle arm in circles Pillow Slides - slide the palm on the lap to the knee and then back to the hip back and forth Door hinge exercises - palm in and thumb out - hug yourself and hitchhike  Back and forth  Do not push pull or lift with the right arm - just do exercises  Follow up with Dr Veverly Fells in two weeks  [phone]  Call for appointment

## 2016-06-16 NOTE — Brief Op Note (Signed)
06/16/2016  4:52 PM  PATIENT:  David Gross  51 y.o. male  PRE-OPERATIVE DIAGNOSIS:  Right shoulder rotator cuff tear, SLAP tear  POST-OPERATIVE DIAGNOSIS:  Right shoulder rotator cuff tear, SLAP tear  PROCEDURE:  Procedure(s): Right shoulder arthroscopy, subacromial decompression, mini-open rotator cuff repair, biceps tenodesis (Right)  SURGEON:  Surgeon(s) and Role:    * Netta Cedars, MD - Primary  PHYSICIAN ASSISTANT:   ASSISTANTS: Ventura Bruns, PA-C   ANESTHESIA:   regional and general  EBL:  Total I/O In: 1000 [I.V.:1000] Out: -   BLOOD ADMINISTERED:none  DRAINS: none   LOCAL MEDICATIONS USED:  MARCAINE     SPECIMEN:  No Specimen  DISPOSITION OF SPECIMEN:  N/A  COUNTS:  YES  TOURNIQUET:  * No tourniquets in log *  DICTATION: .Other Dictation: Dictation Number [id number]  PLAN OF CARE: Discharge to home after PACU  PATIENT DISPOSITION:  PACU - hemodynamically stable.   Delay start of Pharmacological VTE agent (>24hrs) due to surgical blood loss or risk of bleeding: not applicable

## 2016-06-16 NOTE — Anesthesia Procedure Notes (Signed)
Procedure Name: Intubation Date/Time: 06/16/2016 2:53 PM Performed by: Oletta Lamas Pre-anesthesia Checklist: Patient identified, Emergency Drugs available, Suction available and Patient being monitored Patient Re-evaluated:Patient Re-evaluated prior to inductionOxygen Delivery Method: Circle System Utilized Preoxygenation: Pre-oxygenation with 100% oxygen Intubation Type: IV induction Ventilation: Mask ventilation without difficulty Laryngoscope Size: Mac and 4 Grade View: Grade I Tube type: Oral Tube size: 7.5 mm Number of attempts: 1 Airway Equipment and Method: Stylet and Oral airway Placement Confirmation: ETT inserted through vocal cords under direct vision,  positive ETCO2 and breath sounds checked- equal and bilateral Secured at: 23 cm Tube secured with: Tape Dental Injury: Teeth and Oropharynx as per pre-operative assessment

## 2016-06-16 NOTE — Transfer of Care (Signed)
Immediate Anesthesia Transfer of Care Note  Patient: David Gross  Procedure(s) Performed: Procedure(s): Right shoulder arthroscopy, subacromial decompression, mini-open rotator cuff repair, biceps tenodesis (Right)  Patient Location: PACU  Anesthesia Type:General  Level of Consciousness: awake, alert , oriented and patient cooperative  Airway & Oxygen Therapy: Patient Spontanous Breathing and Patient connected to nasal cannula oxygen  Post-op Assessment: Report given to RN and Post -op Vital signs reviewed and stable  Post vital signs: Reviewed and stable  Last Vitals:  Vitals:   06/16/16 1307 06/16/16 1656  BP: (!) 153/95 133/84  Pulse: 63 68  Resp: 20 13  Temp: 36.8 C 36.5 C    Last Pain: There were no vitals filed for this visit.       Complications: No apparent anesthesia complications

## 2016-06-16 NOTE — Interval H&P Note (Signed)
History and Physical Interval Note:  06/16/2016 2:27 PM  David Gross  has presented today for surgery, with the diagnosis of Right shoulder rotator cuff tear, SLAP  The various methods of treatment have been discussed with the patient and family. After consideration of risks, benefits and other options for treatment, the patient has consented to  Procedure(s): Right shoulder arthroscopy, subacromial decompression, mini-open rotator cuff repair, biceps tenodesis (Right) as a surgical intervention .  The patient's history has been reviewed, patient examined, no change in status, stable for surgery.  I have reviewed the patient's chart and labs.  Questions were answered to the patient's satisfaction.     [name],STEVEN R

## 2016-06-16 NOTE — Anesthesia Procedure Notes (Addendum)
Anesthesia Regional Block: Interscalene brachial plexus block   Pre-Anesthetic Checklist: ,, timeout performed, Correct Patient, Correct Site, Correct Laterality, Correct Procedure, Correct Position, site marked, Risks and benefits discussed,  Surgical consent,  Pre-op evaluation,  At surgeon's request and post-op pain management  Laterality: Left  Prep: chloraprep       Needles:  Injection technique: Single-shot  Needle Type: Echogenic Stimulator Needle     Needle Length: 5cm  Needle Gauge: 22     Additional Needles:   Procedures: ultrasound guided, nerve stimulator,,,,,,  Narrative:  Start time: 06/16/2016 1:44 PM End time: 06/16/2016 1:50 PM  Performed by: Personally  Anesthesiologist: Mailani Degroote  Additional Notes: Negative aspiration prior to injection

## 2016-06-19 DIAGNOSIS — M25611 Stiffness of right shoulder, not elsewhere classified: Secondary | ICD-10-CM | POA: Diagnosis not present

## 2016-06-20 ENCOUNTER — Encounter (HOSPITAL_COMMUNITY): Payer: Self-pay | Admitting: Orthopedic Surgery

## 2016-06-23 DIAGNOSIS — M25611 Stiffness of right shoulder, not elsewhere classified: Secondary | ICD-10-CM | POA: Diagnosis not present

## 2016-06-26 DIAGNOSIS — M25611 Stiffness of right shoulder, not elsewhere classified: Secondary | ICD-10-CM | POA: Diagnosis not present

## 2016-06-29 DIAGNOSIS — M25611 Stiffness of right shoulder, not elsewhere classified: Secondary | ICD-10-CM | POA: Diagnosis not present

## 2016-07-04 DIAGNOSIS — M25611 Stiffness of right shoulder, not elsewhere classified: Secondary | ICD-10-CM | POA: Diagnosis not present

## 2016-07-06 DIAGNOSIS — M25611 Stiffness of right shoulder, not elsewhere classified: Secondary | ICD-10-CM | POA: Diagnosis not present

## 2016-07-11 DIAGNOSIS — M25611 Stiffness of right shoulder, not elsewhere classified: Secondary | ICD-10-CM | POA: Diagnosis not present

## 2016-07-14 DIAGNOSIS — M25611 Stiffness of right shoulder, not elsewhere classified: Secondary | ICD-10-CM | POA: Diagnosis not present

## 2016-07-18 DIAGNOSIS — M25611 Stiffness of right shoulder, not elsewhere classified: Secondary | ICD-10-CM | POA: Diagnosis not present

## 2016-07-21 DIAGNOSIS — M25611 Stiffness of right shoulder, not elsewhere classified: Secondary | ICD-10-CM | POA: Diagnosis not present

## 2016-07-25 DIAGNOSIS — M25611 Stiffness of right shoulder, not elsewhere classified: Secondary | ICD-10-CM | POA: Diagnosis not present

## 2016-07-28 DIAGNOSIS — M25611 Stiffness of right shoulder, not elsewhere classified: Secondary | ICD-10-CM | POA: Diagnosis not present

## 2016-08-01 DIAGNOSIS — M25611 Stiffness of right shoulder, not elsewhere classified: Secondary | ICD-10-CM | POA: Diagnosis not present

## 2016-08-04 DIAGNOSIS — M25611 Stiffness of right shoulder, not elsewhere classified: Secondary | ICD-10-CM | POA: Diagnosis not present

## 2016-08-08 DIAGNOSIS — M25611 Stiffness of right shoulder, not elsewhere classified: Secondary | ICD-10-CM | POA: Diagnosis not present

## 2016-08-11 DIAGNOSIS — M25611 Stiffness of right shoulder, not elsewhere classified: Secondary | ICD-10-CM | POA: Diagnosis not present

## 2016-08-15 DIAGNOSIS — M25611 Stiffness of right shoulder, not elsewhere classified: Secondary | ICD-10-CM | POA: Diagnosis not present

## 2016-08-22 DIAGNOSIS — M25611 Stiffness of right shoulder, not elsewhere classified: Secondary | ICD-10-CM | POA: Diagnosis not present

## 2016-08-24 DIAGNOSIS — M25611 Stiffness of right shoulder, not elsewhere classified: Secondary | ICD-10-CM | POA: Diagnosis not present

## 2016-08-30 DIAGNOSIS — M25611 Stiffness of right shoulder, not elsewhere classified: Secondary | ICD-10-CM | POA: Diagnosis not present

## 2016-08-30 DIAGNOSIS — I10 Essential (primary) hypertension: Secondary | ICD-10-CM | POA: Diagnosis not present

## 2016-08-30 DIAGNOSIS — E669 Obesity, unspecified: Secondary | ICD-10-CM | POA: Diagnosis not present

## 2016-09-01 DIAGNOSIS — M25611 Stiffness of right shoulder, not elsewhere classified: Secondary | ICD-10-CM | POA: Diagnosis not present

## 2016-09-05 DIAGNOSIS — M25611 Stiffness of right shoulder, not elsewhere classified: Secondary | ICD-10-CM | POA: Diagnosis not present

## 2016-09-07 DIAGNOSIS — M25611 Stiffness of right shoulder, not elsewhere classified: Secondary | ICD-10-CM | POA: Diagnosis not present

## 2016-09-12 DIAGNOSIS — M25611 Stiffness of right shoulder, not elsewhere classified: Secondary | ICD-10-CM | POA: Diagnosis not present

## 2016-09-14 DIAGNOSIS — M25611 Stiffness of right shoulder, not elsewhere classified: Secondary | ICD-10-CM | POA: Diagnosis not present

## 2016-09-19 DIAGNOSIS — M25611 Stiffness of right shoulder, not elsewhere classified: Secondary | ICD-10-CM | POA: Diagnosis not present

## 2016-09-21 DIAGNOSIS — M25611 Stiffness of right shoulder, not elsewhere classified: Secondary | ICD-10-CM | POA: Diagnosis not present

## 2016-09-26 DIAGNOSIS — Z0000 Encounter for general adult medical examination without abnormal findings: Secondary | ICD-10-CM | POA: Diagnosis not present

## 2016-09-26 DIAGNOSIS — S46011D Strain of muscle(s) and tendon(s) of the rotator cuff of right shoulder, subsequent encounter: Secondary | ICD-10-CM | POA: Diagnosis not present

## 2016-09-26 DIAGNOSIS — Z125 Encounter for screening for malignant neoplasm of prostate: Secondary | ICD-10-CM | POA: Diagnosis not present

## 2016-09-26 DIAGNOSIS — R739 Hyperglycemia, unspecified: Secondary | ICD-10-CM | POA: Diagnosis not present

## 2016-09-26 DIAGNOSIS — Z13220 Encounter for screening for lipoid disorders: Secondary | ICD-10-CM | POA: Diagnosis not present

## 2016-09-26 DIAGNOSIS — Z4789 Encounter for other orthopedic aftercare: Secondary | ICD-10-CM | POA: Diagnosis not present

## 2016-09-28 DIAGNOSIS — K219 Gastro-esophageal reflux disease without esophagitis: Secondary | ICD-10-CM | POA: Diagnosis not present

## 2016-09-28 DIAGNOSIS — Z1211 Encounter for screening for malignant neoplasm of colon: Secondary | ICD-10-CM | POA: Diagnosis not present

## 2016-09-28 DIAGNOSIS — M25611 Stiffness of right shoulder, not elsewhere classified: Secondary | ICD-10-CM | POA: Diagnosis not present

## 2016-09-28 DIAGNOSIS — Z6835 Body mass index (BMI) 35.0-35.9, adult: Secondary | ICD-10-CM | POA: Diagnosis not present

## 2016-09-28 DIAGNOSIS — Z23 Encounter for immunization: Secondary | ICD-10-CM | POA: Diagnosis not present

## 2016-09-28 DIAGNOSIS — E119 Type 2 diabetes mellitus without complications: Secondary | ICD-10-CM | POA: Diagnosis not present

## 2016-09-28 DIAGNOSIS — Z0000 Encounter for general adult medical examination without abnormal findings: Secondary | ICD-10-CM | POA: Diagnosis not present

## 2016-09-28 DIAGNOSIS — E782 Mixed hyperlipidemia: Secondary | ICD-10-CM | POA: Diagnosis not present

## 2016-10-03 DIAGNOSIS — M25611 Stiffness of right shoulder, not elsewhere classified: Secondary | ICD-10-CM | POA: Diagnosis not present

## 2016-10-05 DIAGNOSIS — M25611 Stiffness of right shoulder, not elsewhere classified: Secondary | ICD-10-CM | POA: Diagnosis not present

## 2016-10-10 DIAGNOSIS — M25611 Stiffness of right shoulder, not elsewhere classified: Secondary | ICD-10-CM | POA: Diagnosis not present

## 2016-10-12 DIAGNOSIS — M25611 Stiffness of right shoulder, not elsewhere classified: Secondary | ICD-10-CM | POA: Diagnosis not present

## 2016-10-16 NOTE — Addendum Note (Signed)
Addendum  created 10/16/16 1727 by Annye Asa, MD   Sign clinical note

## 2016-10-17 DIAGNOSIS — M25611 Stiffness of right shoulder, not elsewhere classified: Secondary | ICD-10-CM | POA: Diagnosis not present

## 2016-10-19 DIAGNOSIS — M25611 Stiffness of right shoulder, not elsewhere classified: Secondary | ICD-10-CM | POA: Diagnosis not present

## 2016-10-23 DIAGNOSIS — M25611 Stiffness of right shoulder, not elsewhere classified: Secondary | ICD-10-CM | POA: Diagnosis not present

## 2016-10-25 DIAGNOSIS — M25611 Stiffness of right shoulder, not elsewhere classified: Secondary | ICD-10-CM | POA: Diagnosis not present

## 2016-10-31 DIAGNOSIS — E668 Other obesity: Secondary | ICD-10-CM | POA: Diagnosis not present

## 2016-10-31 DIAGNOSIS — I10 Essential (primary) hypertension: Secondary | ICD-10-CM | POA: Diagnosis not present

## 2016-10-31 DIAGNOSIS — M25611 Stiffness of right shoulder, not elsewhere classified: Secondary | ICD-10-CM | POA: Diagnosis not present

## 2016-11-23 DIAGNOSIS — Z23 Encounter for immunization: Secondary | ICD-10-CM | POA: Diagnosis not present

## 2017-11-27 ENCOUNTER — Other Ambulatory Visit: Payer: Self-pay | Admitting: Family Medicine

## 2017-11-27 ENCOUNTER — Ambulatory Visit
Admission: RE | Admit: 2017-11-27 | Discharge: 2017-11-27 | Disposition: A | Discharge status: Home / Self Care | Payer: No Typology Code available for payment source | Source: Ambulatory Visit | Attending: Family Medicine | Admitting: Family Medicine

## 2017-11-27 DIAGNOSIS — W19XXXA Unspecified fall, initial encounter: Secondary | ICD-10-CM

## 2018-03-04 IMAGING — US US ABDOMEN COMPLETE
1 series · 14 of 25 positions shown · non-contrast
Comparison: Renal ultrasound 04/22/2014

CLINICAL DATA: Elevated LFTs.

EXAM:
ABDOMEN ULTRASOUND COMPLETE

[Series 1: us abdomen complete · 0.37mm/px · 14 of 82 slices shown]
[im 1/82]
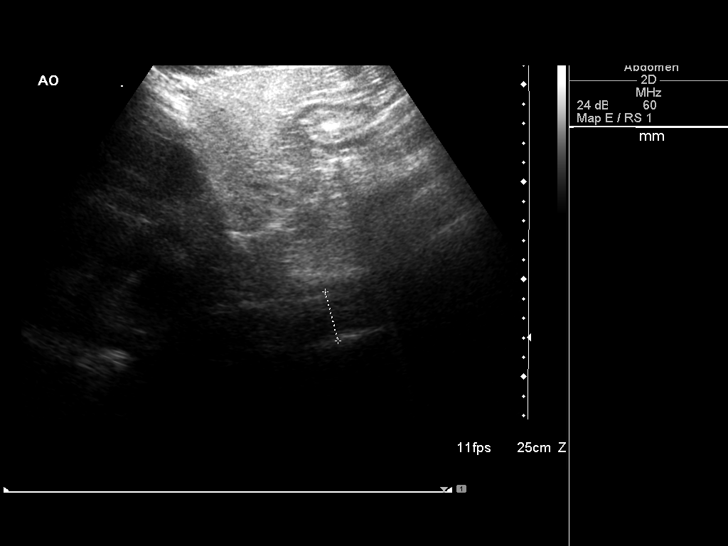
[im 7/82]
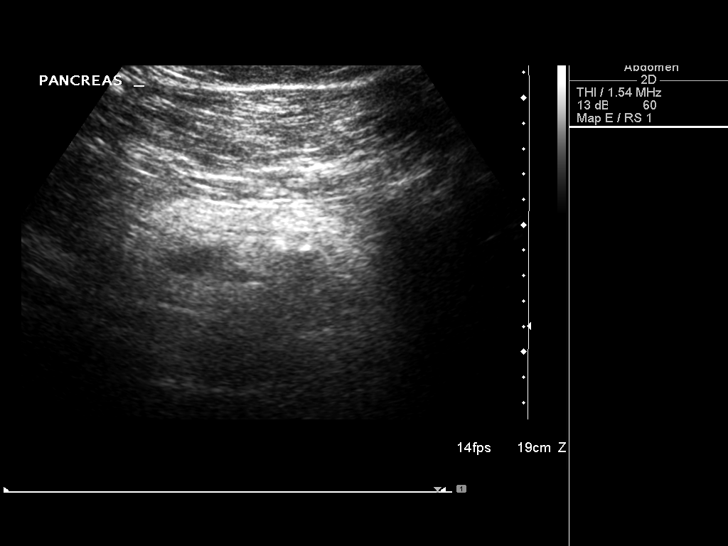
[im 14/82]
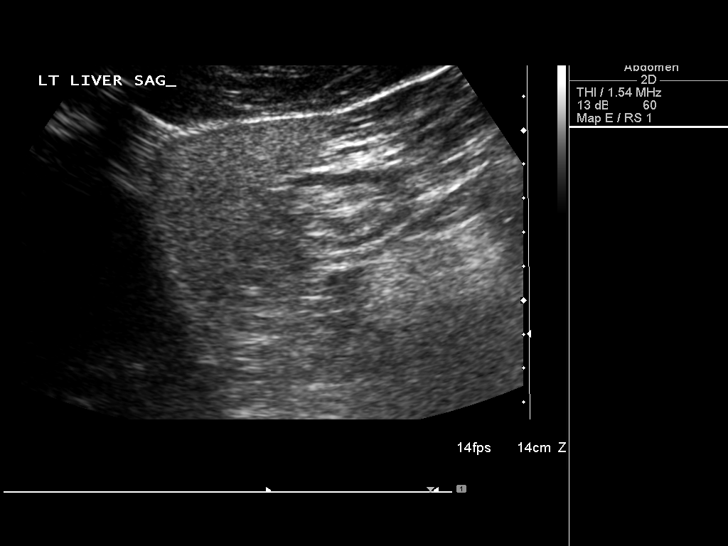
[im 21/82]
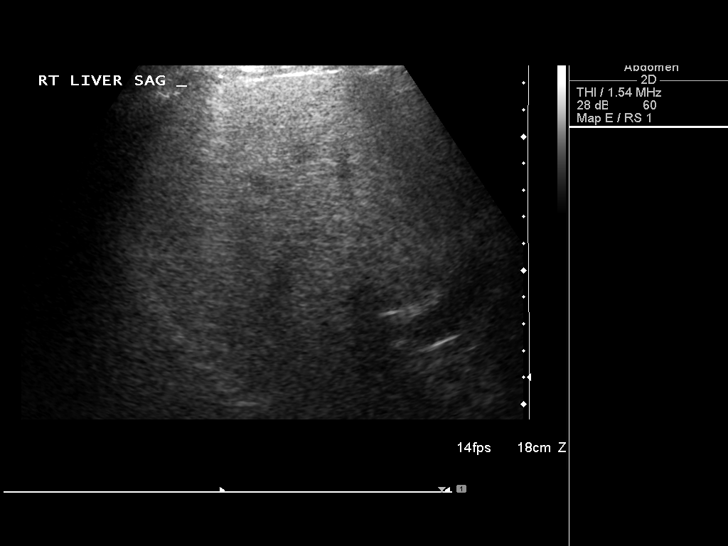
[im 28/82]
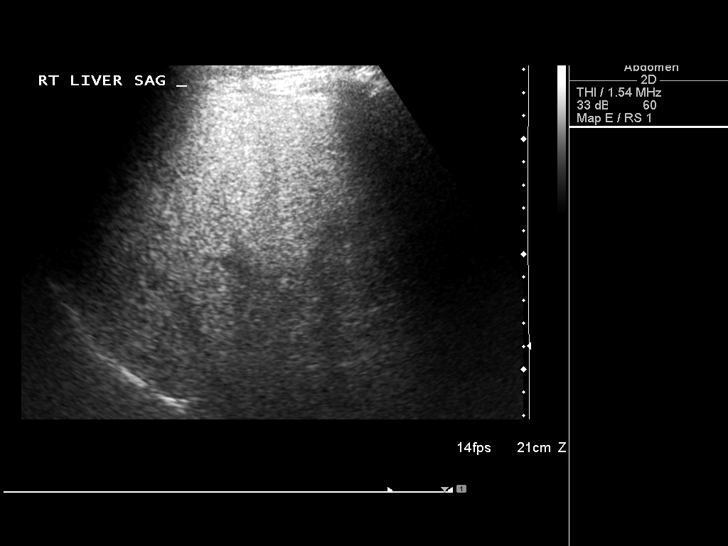
[im 31/82]
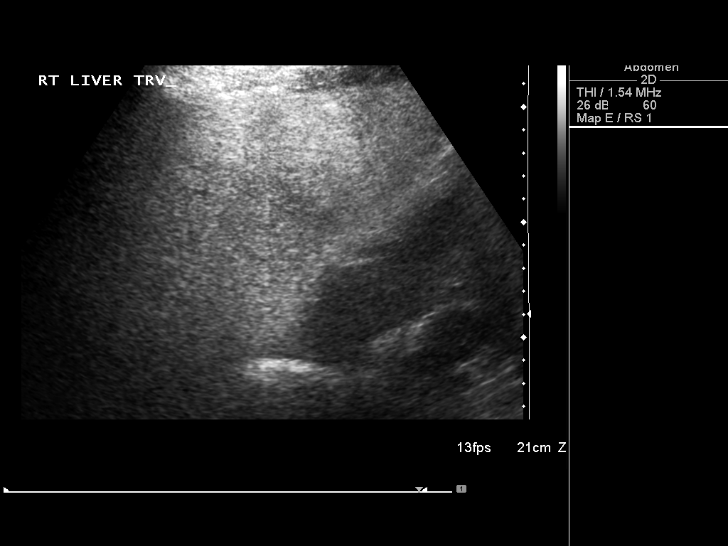
[im 38/82]
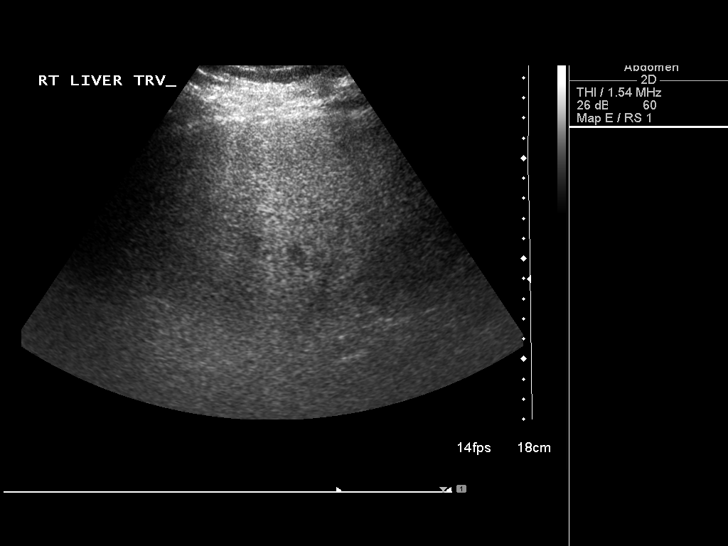
[im 44/82]
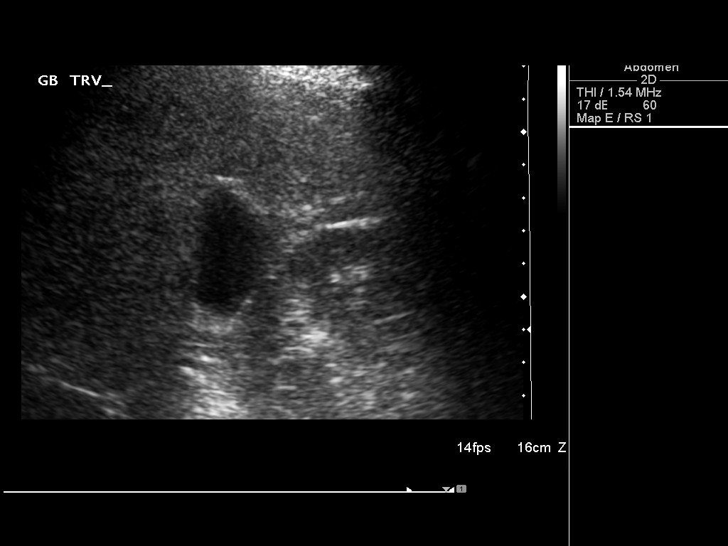
[im 51/82]
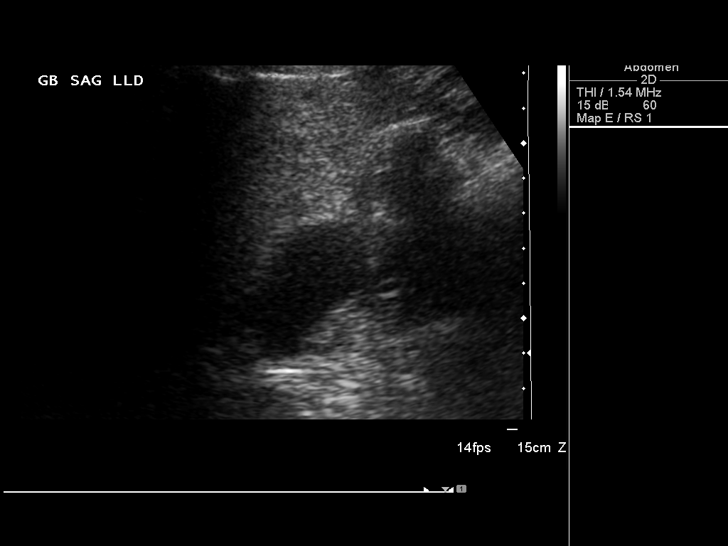
[im 55/82]
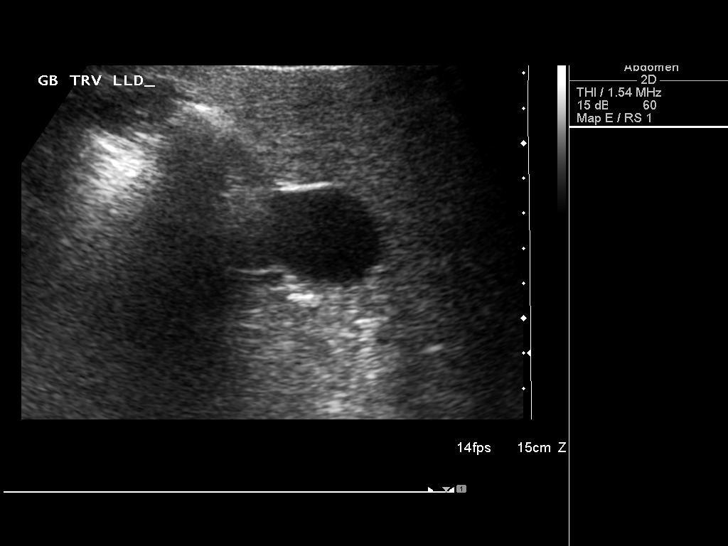
[im 61/82]
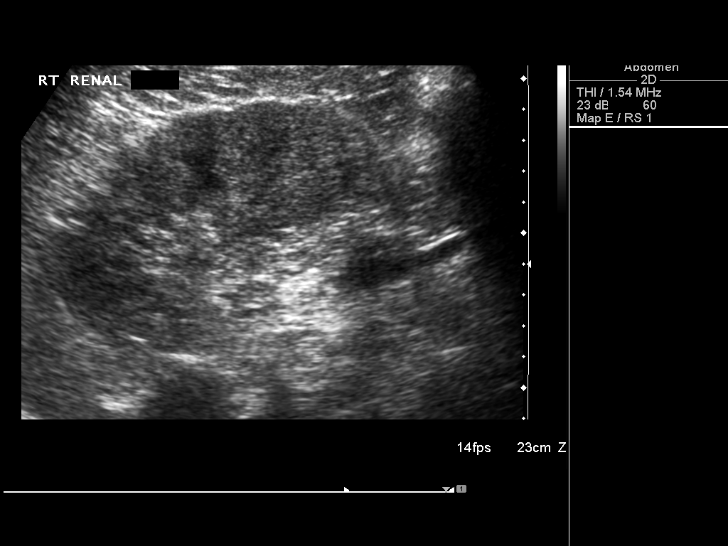
[im 68/82]
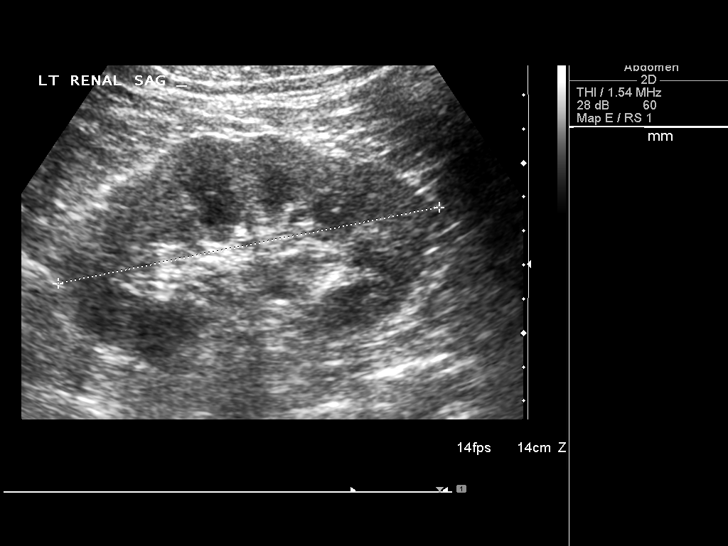
[im 75/82]
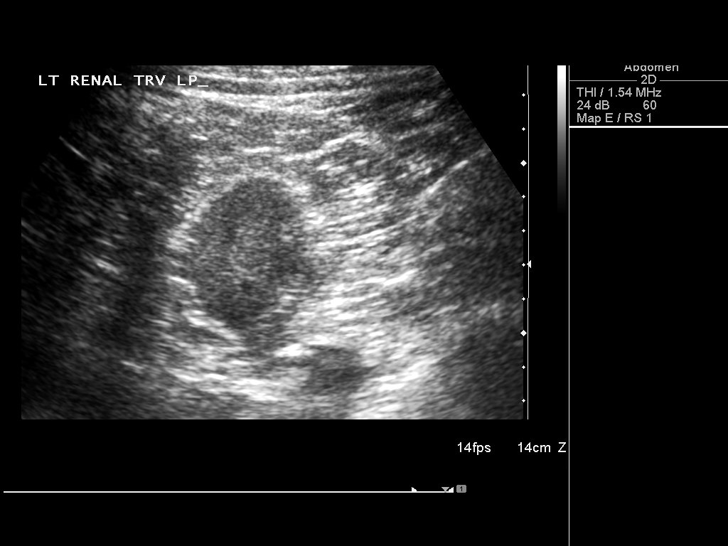
[im 82/82]
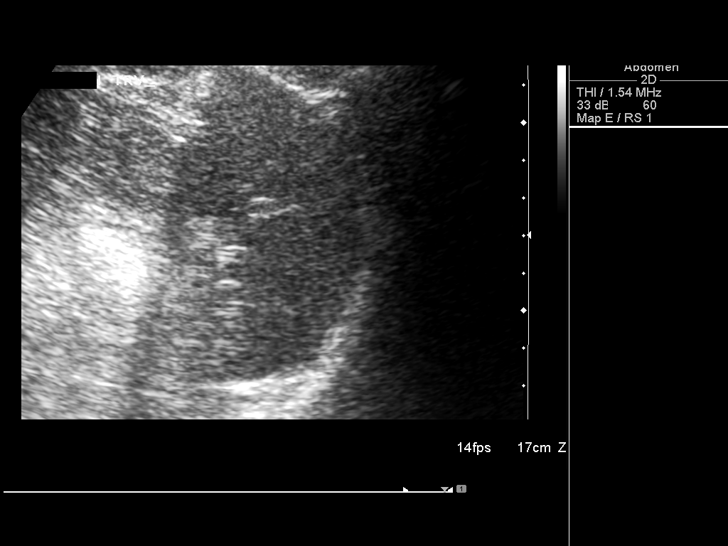

[14 of 25 positions shown; findings below may reference images not displayed]

FINDINGS: Gallbladder: Gallbladder has a normal appearance. Gallbladder wall
is 1.5 mm, within normal limits. No stones or pericholecystic fluid.
No sonographic Murphy's sign.

Common bile duct: Diameter: 4.4 mm

Liver: The liver is echogenic. There is attenuation of the
ultrasound wave, poor visualization of the internal hepatic
architecture, and loss of definition of the diaphragm. No focal
liver lesions are identified.

IVC: No abnormality visualized.

Pancreas: Visualized portion unremarkable.

Spleen: Size and appearance within normal limits.

Right Kidney: Length: 11.8 cm. Echogenicity within normal limits. No
mass or hydronephrosis visualized.

Left Kidney: Length: 11.4 cm. Echogenicity within normal limits. No
mass or hydronephrosis visualized.

Abdominal aorta: No aneurysm visualized.

Other findings: None.
IMPRESSION: 1. Hepatic steatosis.
2. No focal liver lesions identified.
3. No evidence for acute cholecystitis or other acute abnormality of
the abdomen.

## 2018-04-12 DIAGNOSIS — I480 Paroxysmal atrial fibrillation: Secondary | ICD-10-CM | POA: Diagnosis not present

## 2018-04-12 DIAGNOSIS — K219 Gastro-esophageal reflux disease without esophagitis: Secondary | ICD-10-CM | POA: Diagnosis not present

## 2018-04-12 DIAGNOSIS — I10 Essential (primary) hypertension: Secondary | ICD-10-CM | POA: Diagnosis not present

## 2018-04-12 DIAGNOSIS — Z6836 Body mass index (BMI) 36.0-36.9, adult: Secondary | ICD-10-CM | POA: Diagnosis not present

## 2018-04-23 DIAGNOSIS — Z1329 Encounter for screening for other suspected endocrine disorder: Secondary | ICD-10-CM | POA: Diagnosis not present

## 2018-04-23 DIAGNOSIS — Z114 Encounter for screening for human immunodeficiency virus [HIV]: Secondary | ICD-10-CM | POA: Diagnosis not present

## 2018-04-23 DIAGNOSIS — Z13220 Encounter for screening for lipoid disorders: Secondary | ICD-10-CM | POA: Diagnosis not present

## 2018-04-23 DIAGNOSIS — Z0000 Encounter for general adult medical examination without abnormal findings: Secondary | ICD-10-CM | POA: Diagnosis not present

## 2018-04-23 DIAGNOSIS — Z125 Encounter for screening for malignant neoplasm of prostate: Secondary | ICD-10-CM | POA: Diagnosis not present

## 2018-04-26 DIAGNOSIS — R739 Hyperglycemia, unspecified: Secondary | ICD-10-CM | POA: Diagnosis not present

## 2018-04-26 DIAGNOSIS — J069 Acute upper respiratory infection, unspecified: Secondary | ICD-10-CM | POA: Diagnosis not present

## 2018-04-26 DIAGNOSIS — Z1211 Encounter for screening for malignant neoplasm of colon: Secondary | ICD-10-CM | POA: Diagnosis not present

## 2018-04-26 DIAGNOSIS — Z0000 Encounter for general adult medical examination without abnormal findings: Secondary | ICD-10-CM | POA: Diagnosis not present

## 2018-04-26 DIAGNOSIS — Z23 Encounter for immunization: Secondary | ICD-10-CM | POA: Diagnosis not present

## 2018-05-02 DIAGNOSIS — H5213 Myopia, bilateral: Secondary | ICD-10-CM | POA: Diagnosis not present

## 2018-07-12 DIAGNOSIS — R739 Hyperglycemia, unspecified: Secondary | ICD-10-CM | POA: Diagnosis not present

## 2018-07-12 DIAGNOSIS — E782 Mixed hyperlipidemia: Secondary | ICD-10-CM | POA: Diagnosis not present

## 2018-07-12 DIAGNOSIS — I10 Essential (primary) hypertension: Secondary | ICD-10-CM | POA: Diagnosis not present

## 2018-07-12 DIAGNOSIS — I4891 Unspecified atrial fibrillation: Secondary | ICD-10-CM | POA: Diagnosis not present

## 2018-09-04 DIAGNOSIS — R739 Hyperglycemia, unspecified: Secondary | ICD-10-CM | POA: Diagnosis not present

## 2018-09-04 DIAGNOSIS — I10 Essential (primary) hypertension: Secondary | ICD-10-CM | POA: Diagnosis not present

## 2018-09-10 DIAGNOSIS — R739 Hyperglycemia, unspecified: Secondary | ICD-10-CM | POA: Diagnosis not present

## 2018-09-10 DIAGNOSIS — I10 Essential (primary) hypertension: Secondary | ICD-10-CM | POA: Diagnosis not present

## 2018-10-18 IMAGING — CR DG ORBITS FOR FOREIGN BODY
2 series · 2 of 2 positions shown · non-contrast
Comparison: CT of the head performed 01/29/2014

CLINICAL DATA: Metal working/exposure; clearance prior to MRI.

EXAM:
ORBITS FOR FOREIGN BODY - 2 VIEW

[w waters (1 of 2)]
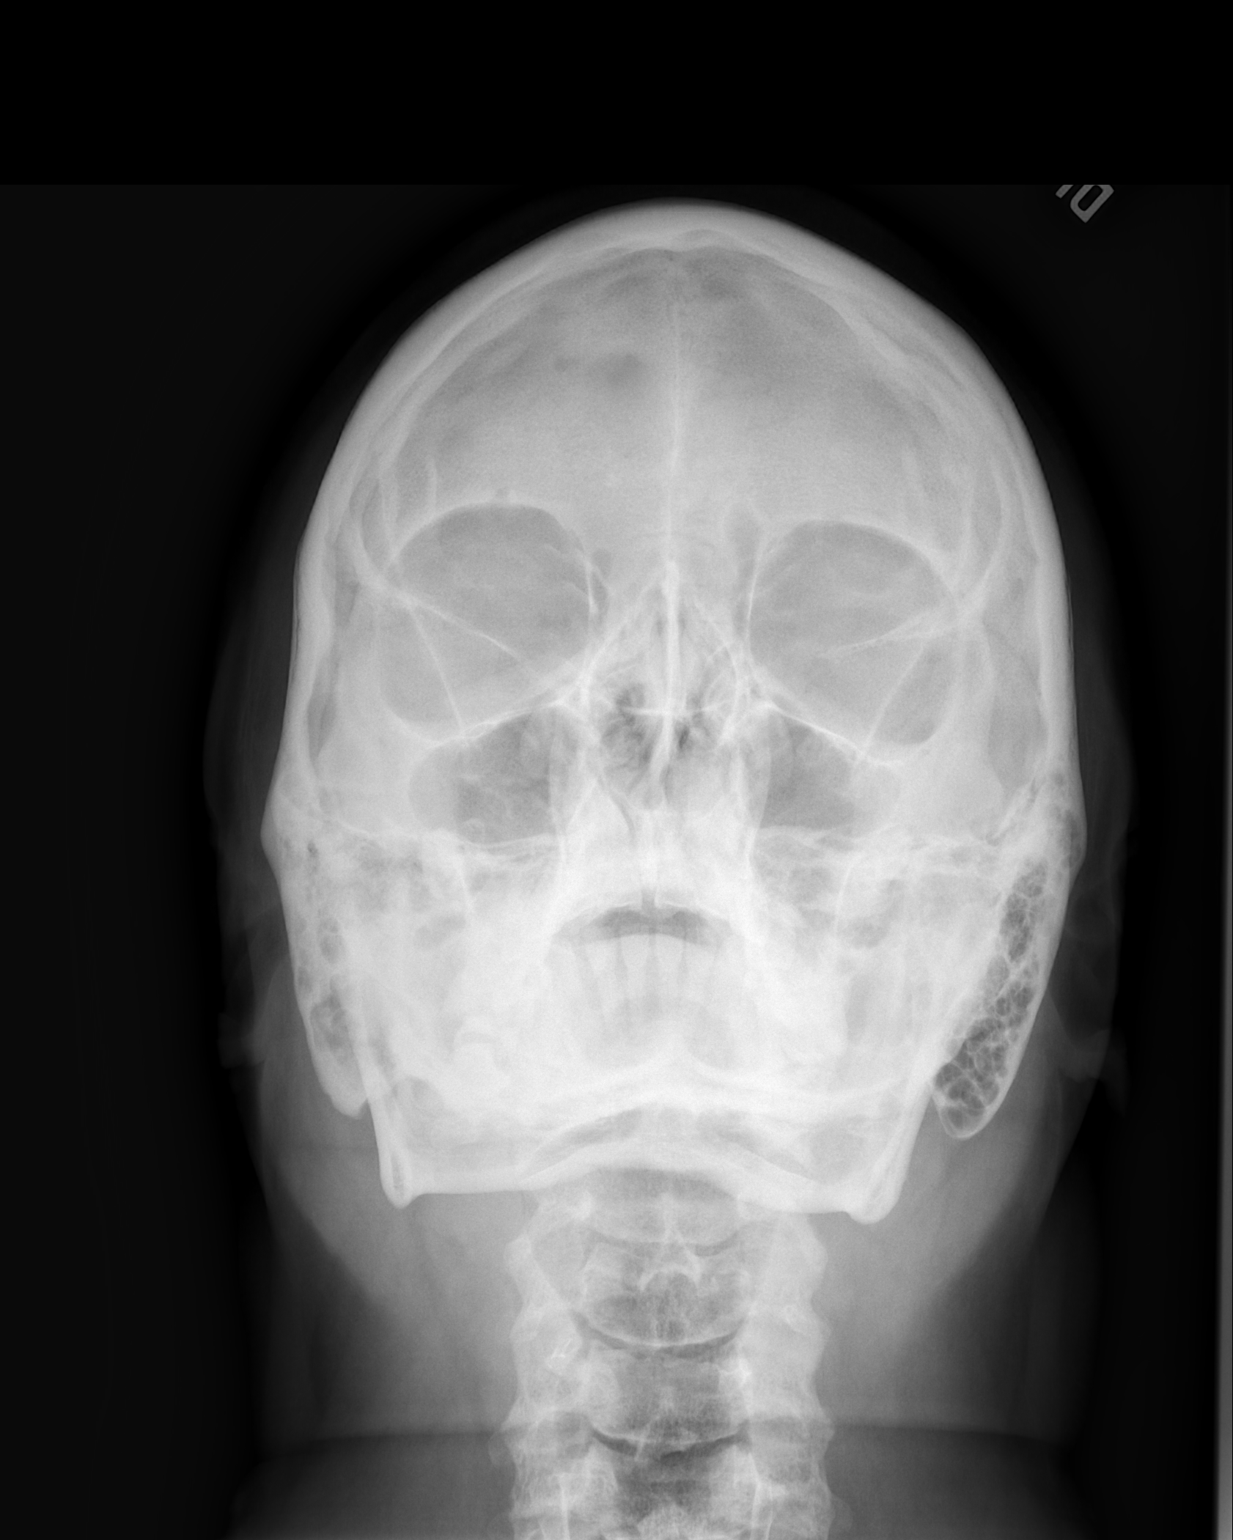

[w waters (2 of 2)]
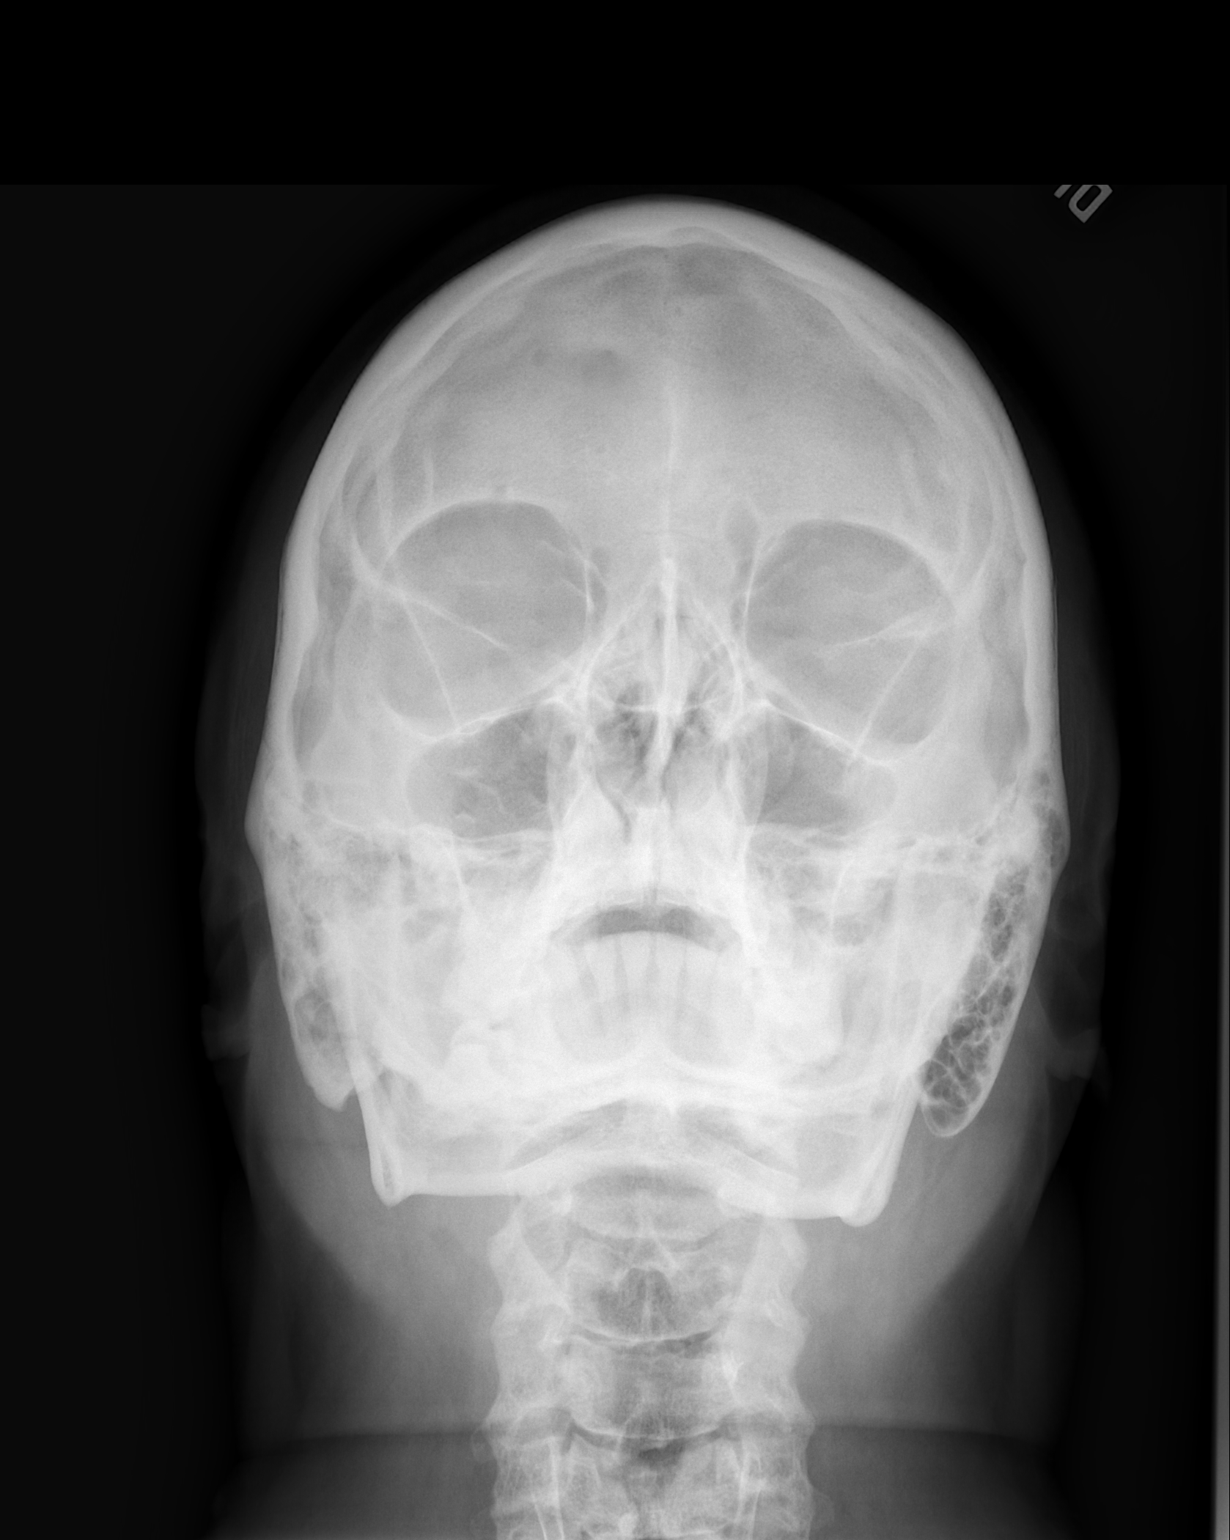

[2 of 2 positions shown; findings below may reference images not displayed]

FINDINGS: There is no evidence of metallic foreign body within the orbits. No
significant bone abnormality identified. The visualized paranasal
sinuses and mastoid air cells are well-aerated.
IMPRESSION: No evidence of metallic foreign body within the orbits.
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
## 2019-11-05 ENCOUNTER — Other Ambulatory Visit: Payer: Self-pay | Admitting: Physician Assistant

## 2019-11-05 DIAGNOSIS — U071 COVID-19: Secondary | ICD-10-CM

## 2019-11-05 DIAGNOSIS — I10 Essential (primary) hypertension: Secondary | ICD-10-CM

## 2019-11-05 MED ORDER — SODIUM CHLORIDE 0.9 % IV SOLN
Freq: Once | INTRAVENOUS | Status: AC
  Filled 2019-11-05: qty 600

## 2019-11-05 NOTE — Progress Notes (Signed)
I connected by phone with David Gross on 11/05/2019 at 8:58 PM to discuss the potential use of a new treatment for mild to moderate COVID-19 viral infection in non-hospitalized patients.  This patient is a 54 y.o. male that meets the FDA criteria for Emergency Use Authorization of COVID monoclonal antibody casirivimab/imdevimab.  Has a (+) direct SARS-CoV-2 viral test result  Has mild or moderate COVID-19   Is NOT hospitalized due to COVID-19  Is within 10 days of symptom onset  Has at least one of the high risk factor(s) for progression to severe COVID-19 and/or hospitalization as defined in EUA.  Specific high risk criteria : Cardiovascular disease or hypertension   I have spoken and communicated the following to the patient or parent/caregiver regarding COVID monoclonal antibody treatment:  1. FDA has authorized the emergency use for the treatment of mild to moderate COVID-19 in adults and pediatric patients with positive results of direct SARS-CoV-2 viral testing who are 48 years of age and older weighing at least 40 kg, and who are at high risk for progressing to severe COVID-19 and/or hospitalization.  2. The significant known and potential risks and benefits of COVID monoclonal antibody, and the extent to which such potential risks and benefits are unknown.  3. Information on available alternative treatments and the risks and benefits of those alternatives, including clinical trials.  4. Patients treated with COVID monoclonal antibody should continue to self-isolate and use infection control measures (e.g., wear mask, isolate, social distance, avoid sharing personal items, clean and disinfect "high touch" surfaces, and frequent handwashing) according to CDC guidelines.   5. The patient or parent/caregiver has the option to accept or refuse COVID monoclonal antibody treatment.  After reviewing this information with the patient, The patient agreed to proceed with receiving  casirivimab\imdevimab infusion and will be provided a copy of the Fact sheet prior to receiving the infusion.  Sx onset 8/2. Set up for infusion on 8/5 @ 12:30pm. Directions given to [facility]. Pt is aware that he will be billed an infusion fee.   [name] Form 11/05/2019 8:58 PM

## 2019-11-06 ENCOUNTER — Ambulatory Visit (HOSPITAL_COMMUNITY)
Admission: RE | Admit: 2019-11-06 | Discharge: 2019-11-06 | Disposition: A | Discharge status: Home / Self Care | Payer: HRSA Program | Source: Ambulatory Visit | Attending: Pulmonary Disease | Admitting: Pulmonary Disease

## 2019-11-06 DIAGNOSIS — I10 Essential (primary) hypertension: Secondary | ICD-10-CM | POA: Insufficient documentation

## 2019-11-06 DIAGNOSIS — U071 COVID-19: Secondary | ICD-10-CM | POA: Diagnosis not present

## 2019-11-06 MED ORDER — ALBUTEROL SULFATE HFA 108 (90 BASE) MCG/ACT IN AERS: 2.0000 | INHALATION_SPRAY | Freq: Once | RESPIRATORY_TRACT | Status: DC | PRN

## 2019-11-06 MED ORDER — SODIUM CHLORIDE 0.9 % IV SOLN: INTRAVENOUS | Status: DC | PRN

## 2019-11-06 MED ORDER — EPINEPHRINE 0.3 MG/0.3ML IJ SOAJ: 0.3000 mg | Freq: Once | INTRAMUSCULAR | Status: DC | PRN

## 2019-11-06 MED ORDER — FAMOTIDINE IN NACL 20-0.9 MG/50ML-% IV SOLN: 20.0000 mg | Freq: Once | INTRAVENOUS | Status: DC | PRN

## 2019-11-06 MED ORDER — METHYLPREDNISOLONE SODIUM SUCC 125 MG IJ SOLR: 125.0000 mg | Freq: Once | INTRAMUSCULAR | Status: DC | PRN

## 2019-11-06 MED ORDER — DIPHENHYDRAMINE HCL 50 MG/ML IJ SOLN: 50.0000 mg | Freq: Once | INTRAMUSCULAR | Status: DC | PRN

## 2019-11-06 NOTE — Progress Notes (Signed)
  Diagnosis: COVID-19  Physician: Dr Joya Gaskins  Procedure: [facility] Med: casirivimab\imdevimab infusion - Provided patient with casirivimab\imdevimab fact sheet for patients, parents and caregivers prior to infusion.  Complications: No immediate complications noted.  Discharge: Discharged home   David Gross 11/06/2019

## 2019-11-06 NOTE — Discharge Instructions (Signed)

## 2021-10-21 ENCOUNTER — Encounter: Payer: Self-pay | Admitting: Orthopaedic Surgery

## 2021-10-21 ENCOUNTER — Ambulatory Visit (INDEPENDENT_AMBULATORY_CARE_PROVIDER_SITE_OTHER): Payer: 59

## 2021-10-21 ENCOUNTER — Ambulatory Visit (INDEPENDENT_AMBULATORY_CARE_PROVIDER_SITE_OTHER): Payer: 59 | Admitting: Orthopaedic Surgery

## 2021-10-21 DIAGNOSIS — M25532 Pain in left wrist: Secondary | ICD-10-CM
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
## 2021-10-24 MED ORDER — LIDOCAINE HCL 1 % IJ SOLN
0.3000 mL | INTRAMUSCULAR | Status: AC | PRN
  Administered 2021-10-21: .3 mL

## 2021-10-24 MED ORDER — BUPIVACAINE HCL 0.25 % IJ SOLN
0.3300 mL | INTRAMUSCULAR | Status: AC | PRN
  Administered 2021-10-21: .33 mL

## 2021-10-24 MED ORDER — METHYLPREDNISOLONE ACETATE 40 MG/ML IJ SUSP
13.3300 mg | INTRAMUSCULAR | Status: AC | PRN
  Administered 2021-10-21: 13.33 mg

## 2022-05-24 ENCOUNTER — Other Ambulatory Visit: Payer: Self-pay | Admitting: Family Medicine

## 2022-05-24 ENCOUNTER — Ambulatory Visit
Admission: RE | Admit: 2022-05-24 | Discharge: 2022-05-24 | Disposition: A | Discharge status: Home / Self Care | Payer: Medicaid Other | Source: Ambulatory Visit | Attending: Family Medicine | Admitting: Family Medicine

## 2022-05-24 DIAGNOSIS — M25539 Pain in unspecified wrist: Secondary | ICD-10-CM

## 2022-05-24 DIAGNOSIS — I10 Essential (primary) hypertension: Secondary | ICD-10-CM | POA: Diagnosis not present

## 2022-05-24 DIAGNOSIS — M25532 Pain in left wrist: Secondary | ICD-10-CM | POA: Diagnosis not present

## 2022-05-24 DIAGNOSIS — B351 Tinea unguium: Secondary | ICD-10-CM | POA: Diagnosis not present

## 2022-08-01 DIAGNOSIS — G720 Drug-induced myopathy: Secondary | ICD-10-CM | POA: Diagnosis not present

## 2022-08-01 DIAGNOSIS — M1810 Unilateral primary osteoarthritis of first carpometacarpal joint, unspecified hand: Secondary | ICD-10-CM | POA: Diagnosis not present

## 2022-08-01 DIAGNOSIS — E782 Mixed hyperlipidemia: Secondary | ICD-10-CM | POA: Diagnosis not present

## 2022-08-01 DIAGNOSIS — R7303 Prediabetes: Secondary | ICD-10-CM | POA: Diagnosis not present

## 2022-08-01 DIAGNOSIS — E559 Vitamin D deficiency, unspecified: Secondary | ICD-10-CM | POA: Diagnosis not present

## 2022-08-01 DIAGNOSIS — M778 Other enthesopathies, not elsewhere classified: Secondary | ICD-10-CM | POA: Diagnosis not present

## 2022-08-01 DIAGNOSIS — Z789 Other specified health status: Secondary | ICD-10-CM | POA: Diagnosis not present

## 2022-08-08 ENCOUNTER — Other Ambulatory Visit: Payer: Self-pay | Admitting: Family Medicine

## 2022-08-08 DIAGNOSIS — E785 Hyperlipidemia, unspecified: Secondary | ICD-10-CM

## 2022-09-18 ENCOUNTER — Other Ambulatory Visit: Payer: Medicaid Other

## 2022-10-27 DIAGNOSIS — Z0000 Encounter for general adult medical examination without abnormal findings: Secondary | ICD-10-CM | POA: Diagnosis not present

## 2022-10-27 DIAGNOSIS — Z23 Encounter for immunization: Secondary | ICD-10-CM | POA: Diagnosis not present

## 2022-10-27 DIAGNOSIS — I10 Essential (primary) hypertension: Secondary | ICD-10-CM | POA: Diagnosis not present

## 2023-01-20 ENCOUNTER — Other Ambulatory Visit: Payer: Self-pay

## 2023-01-20 ENCOUNTER — Encounter (HOSPITAL_BASED_OUTPATIENT_CLINIC_OR_DEPARTMENT_OTHER): Payer: Self-pay | Admitting: Emergency Medicine

## 2023-01-20 ENCOUNTER — Emergency Department (HOSPITAL_BASED_OUTPATIENT_CLINIC_OR_DEPARTMENT_OTHER)
Admission: EM | Admit: 2023-01-20 | Discharge: 2023-01-20 | Disposition: A | Discharge status: Home / Self Care | Payer: 59 | Attending: Emergency Medicine | Admitting: Emergency Medicine

## 2023-01-20 ENCOUNTER — Emergency Department (HOSPITAL_BASED_OUTPATIENT_CLINIC_OR_DEPARTMENT_OTHER): Payer: 59

## 2023-01-20 DIAGNOSIS — M25532 Pain in left wrist: Secondary | ICD-10-CM | POA: Diagnosis not present

## 2023-01-20 DIAGNOSIS — M654 Radial styloid tenosynovitis [de Quervain]: Secondary | ICD-10-CM | POA: Diagnosis not present

## 2023-01-20 MED ORDER — KETOROLAC TROMETHAMINE 15 MG/ML IJ SOLN
15.0000 mg | Freq: Once | INTRAMUSCULAR | Status: AC
  Administered 2023-01-20: 15 mg via INTRAMUSCULAR
  Filled 2023-01-20: qty 1

## 2023-01-20 MED ORDER — ACETAMINOPHEN 500 MG PO TABS
1000.0000 mg | ORAL_TABLET | Freq: Once | ORAL | Status: AC
  Administered 2023-01-20: 1000 mg via ORAL
  Filled 2023-01-20: qty 2
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
## 2023-01-23 ENCOUNTER — Encounter: Payer: Self-pay | Admitting: Physician Assistant

## 2023-01-23 ENCOUNTER — Ambulatory Visit (INDEPENDENT_AMBULATORY_CARE_PROVIDER_SITE_OTHER): Payer: 59 | Admitting: Physician Assistant

## 2023-01-23 DIAGNOSIS — M1812 Unilateral primary osteoarthritis of first carpometacarpal joint, left hand: Secondary | ICD-10-CM | POA: Diagnosis not present

## 2023-01-23 MED ORDER — BUPIVACAINE HCL 0.25 % IJ SOLN
0.3300 mL | INTRAMUSCULAR | Status: AC | PRN
  Administered 2023-01-23: .33 mL

## 2023-01-23 MED ORDER — TRAMADOL HCL 50 MG PO TABS: 50.0000 mg | ORAL_TABLET | Freq: Two times a day (BID) | ORAL | 2 refills | Status: AC | PRN

## 2023-01-23 MED ORDER — LIDOCAINE HCL 1 % IJ SOLN
3.0000 mL | INTRAMUSCULAR | Status: AC | PRN
  Administered 2023-01-23: 3 mL

## 2023-01-23 MED ORDER — METHYLPREDNISOLONE ACETATE 40 MG/ML IJ SUSP
13.3300 mg | INTRAMUSCULAR | Status: AC | PRN
  Administered 2023-01-23: 13.33 mg
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
